# Patient Record
Sex: Female | Born: 1980 | Hispanic: Yes | Marital: Single | Smoking: Never smoker
Health system: Southern US, Community
[De-identification: ages and names within clinical notes are randomized; demographics above are authoritative.]

## PROBLEM LIST (undated history)

## (undated) DIAGNOSIS — Z87440 Personal history of urinary (tract) infections: Secondary | ICD-10-CM

## (undated) DIAGNOSIS — H538 Other visual disturbances: Secondary | ICD-10-CM

## (undated) DIAGNOSIS — D649 Anemia, unspecified: Secondary | ICD-10-CM

## (undated) DIAGNOSIS — F32A Depression, unspecified: Secondary | ICD-10-CM

## (undated) DIAGNOSIS — R635 Abnormal weight gain: Secondary | ICD-10-CM

## (undated) DIAGNOSIS — O24419 Gestational diabetes mellitus in pregnancy, unspecified control: Secondary | ICD-10-CM

## (undated) DIAGNOSIS — Z862 Personal history of diseases of the blood and blood-forming organs and certain disorders involving the immune mechanism: Secondary | ICD-10-CM

## (undated) DIAGNOSIS — Z8632 Personal history of gestational diabetes: Secondary | ICD-10-CM

## (undated) DIAGNOSIS — N898 Other specified noninflammatory disorders of vagina: Secondary | ICD-10-CM

## (undated) DIAGNOSIS — R87629 Unspecified abnormal cytological findings in specimens from vagina: Secondary | ICD-10-CM

## (undated) HISTORY — DX: Other specified noninflammatory disorders of vagina: N89.8

## (undated) HISTORY — DX: Personal history of urinary (tract) infections: Z87.440

## (undated) HISTORY — DX: Abnormal weight gain: R63.5

## (undated) HISTORY — DX: Other visual disturbances: H53.8

## (undated) HISTORY — DX: Unspecified abnormal cytological findings in specimens from vagina: R87.629

## (undated) HISTORY — DX: Depression, unspecified: F32.A

## (undated) HISTORY — DX: Gestational diabetes mellitus in pregnancy, unspecified control: O24.419

## (undated) HISTORY — DX: Personal history of diseases of the blood and blood-forming organs and certain disorders involving the immune mechanism: Z86.2

## (undated) HISTORY — PX: LEEP: SHX91

## (undated) HISTORY — DX: Personal history of gestational diabetes: Z86.32

---

## 2003-03-10 DIAGNOSIS — O99891 Other specified diseases and conditions complicating pregnancy: Secondary | ICD-10-CM

## 2003-03-10 DIAGNOSIS — Z5189 Encounter for other specified aftercare: Secondary | ICD-10-CM

## 2003-03-10 HISTORY — DX: Encounter for other specified aftercare: Z51.89

## 2005-06-02 ENCOUNTER — Emergency Department: Payer: Self-pay | Admitting: Emergency Medicine

## 2008-08-16 ENCOUNTER — Emergency Department: Payer: Self-pay | Admitting: Unknown Physician Specialty

## 2011-10-05 ENCOUNTER — Emergency Department: Payer: Self-pay | Admitting: Emergency Medicine

## 2012-03-12 ENCOUNTER — Inpatient Hospital Stay: Payer: Self-pay | Admitting: Internal Medicine

## 2012-03-12 DIAGNOSIS — O24419 Gestational diabetes mellitus in pregnancy, unspecified control: Secondary | ICD-10-CM

## 2012-03-12 LAB — URINALYSIS, COMPLETE
Bilirubin,UR: NEGATIVE
Blood: NEGATIVE
Glucose,UR: NEGATIVE mg/dL (ref 0–75)
Leukocyte Esterase: NEGATIVE
Nitrite: NEGATIVE
Ph: 6 (ref 4.5–8.0)
Protein: NEGATIVE
RBC,UR: NONE SEEN /HPF (ref 0–5)
Specific Gravity: 1.006 (ref 1.003–1.030)
Squamous Epithelial: <1

## 2012-03-12 LAB — CBC WITH DIFFERENTIAL/PLATELET
Basophil #: 0 10*3/uL (ref 0.0–0.1)
Basophil %: 0.3 %
HCT: 37.1 % (ref 35.0–47.0)
HGB: 12.3 g/dL (ref 12.0–16.0)
MCH: 30.7 pg (ref 26.0–34.0)
MCHC: 33 g/dL (ref 32.0–36.0)
MCV: 93 fL (ref 80–100)
Monocyte #: 1 x10 3/mm — ABNORMAL HIGH (ref 0.2–0.9)
Monocyte %: 6.8 %
Platelet: 268 10*3/uL (ref 150–440)
RBC: 3.99 10*6/uL (ref 3.80–5.20)
WBC: 14.3 10*3/uL — ABNORMAL HIGH (ref 3.6–11.0)

## 2012-03-13 LAB — HEMATOCRIT: HCT: 30.9 % — ABNORMAL LOW (ref 35.0–47.0)

## 2012-03-28 ENCOUNTER — Emergency Department: Payer: Self-pay | Admitting: Unknown Physician Specialty

## 2012-03-28 LAB — COMPREHENSIVE METABOLIC PANEL
Alkaline Phosphatase: 140 U/L — ABNORMAL HIGH (ref 50–136)
Anion Gap: 12 (ref 7–16)
BUN: 11 mg/dL (ref 7–18)
Calcium, Total: 9.3 mg/dL (ref 8.5–10.1)
Chloride: 104 mmol/L (ref 98–107)
Co2: 22 mmol/L (ref 21–32)
Creatinine: 1.02 mg/dL (ref 0.60–1.30)
EGFR (African American): >60
EGFR (Non-African Amer.): >60
Osmolality: 275 (ref 275–301)
Potassium: 3.7 mmol/L (ref 3.5–5.1)
SGOT(AST): 35 U/L (ref 15–37)
Sodium: 138 mmol/L (ref 136–145)
Total Protein: 8.5 g/dL — ABNORMAL HIGH (ref 6.4–8.2)

## 2012-03-28 LAB — CBC
MCH: 30.4 pg (ref 26.0–34.0)
MCHC: 33.1 g/dL (ref 32.0–36.0)
Platelet: 382 10*3/uL (ref 150–440)
RBC: 4.31 10*6/uL (ref 3.80–5.20)
RDW: 13.8 % (ref 11.5–14.5)

## 2014-06-21 ENCOUNTER — Encounter: Payer: Self-pay | Admitting: Obstetrics and Gynecology

## 2014-07-26 ENCOUNTER — Encounter: Payer: Self-pay | Admitting: Obstetrics and Gynecology

## 2014-09-08 ENCOUNTER — Ambulatory Visit: Payer: Self-pay | Admitting: Physician Assistant

## 2014-11-25 ENCOUNTER — Observation Stay: Payer: Self-pay

## 2014-11-25 LAB — WET PREP, GENITAL

## 2014-12-13 ENCOUNTER — Ambulatory Visit: Payer: Self-pay | Admitting: Family Medicine

## 2015-01-03 ENCOUNTER — Inpatient Hospital Stay: Payer: Self-pay

## 2015-03-08 NOTE — H&P (Signed)
L&D Evaluation:  History:   HPI 34 year old G6P5005 presents to L&D at 40 weeks 6 days with c/o ctx's.  EDD 03/06/12, PNC at [facility] notable for early entry to care, elevated 1 hour glucola, WNL 3 hour GTT.  Ob hx of 5 previous vaginal deliveries (all female in 5198, 2201, 302, 04, 06) all of which were LGA (9-8, 10-8, 10-9, 9-8) except for 1st child (6-7). With 4th delivery pt had PP hemorrhage and 4th degree tear- she recieved blood transfusion (states she passed out and ex-husband made decision for her but she is Jehov[employer]'s Witness and WILL NOT accept transfusion). 5th child she had no complications other than fast labor (states baby [facility] 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  Labs: O Positive, RI, VZ Non-immune, Hep B Neg, HIV Neg, RPR NR early glucola 114, repeat 139, 3 hour all normal values GC/Chlam and GBS NEgative Tdap recieved 12/14/11    Presents with contractions    Patient's Medical History other  abnormal pap smear    Patient's Surgical History other  CONE bx    Medications Pre Natal Vitamins    Allergies NKDA    Social History none    Family History Non-Contributory   ROS:   ROS All systems were reviewed.  HEENT, CNS, GI, GU, Respiratory, CV, Renal and Musculoskeletal systems were found to be normal.   Exam:   Vital Signs stable    Urine Protein not completed    General no apparent distress    Mental Status clear    Abdomen gravid, tender with contractions    Estimated Fetal Weight Large for gestational age    Edema no edema    Pelvic 1-2 cm per RN exam, 90% effaced    Mebranes Intact    FHT normal rate with no decels    Ucx irregular   Impression:   Impression early labor, postdates   Plan:   Plan EFM/NST, monitor contractions and for cervical change, admit for labor, may start Pitocin for induction if no cervical  change, AROM when appropriate.   Electronic Signatures: Shella Maximutnam, Eliyana Pagliaro (CNM)  (Signed 15-May-13 12:35)  Authored: L&D Evaluation   Last Updated: 15-May-13 12:35 by Shella MaximPutnam, Jatniel Verastegui (CNM)

## 2015-03-08 NOTE — H&P (Signed)
L&D Evaluation:  History:  HPI Pt is a 34 yo G7P6006 at 40.[redacted] weeks GA with an EDC of 12/30/14 pt of the health department who presents to L&D with reports of contractions. Her pregnancy history is significant for history of LGA babies with the largest being 10#9oz, PPH with transfusion in 2004, and shoulder dystocia with one of her 10# babies. This pregnancy has been uncomplicated. She is O+, RI, VI, GBS negative. She recieved her flu and Tdap vaccine this pregnancy.   Presents with contractions   Patient's Medical History GDM in past pregnancy, CIN3   Patient's Surgical History other  cold knife cone   Medications Pre Natal Vitamins   Allergies NKDA   Social History none   Family History Non-Contributory   ROS:  ROS All systems were reviewed.  HEENT, CNS, GI, GU, Respiratory, CV, Renal and Musculoskeletal systems were found to be normal.   Exam:  Vital Signs stable   General no apparent distress   Mental Status clear   Chest clear   Heart normal sinus rhythm   Abdomen gravid, tender with contractions   Pelvic no external lesions, 2-3/80/-2 upon admission around 7am   Mebranes Intact   FHT normal rate with no decels, 130s, moderate variability, +accels, no decels   Ucx irregular, every 3-6 minutes   Skin dry, no lesions, no rashes   Lymph no lymphadenopathy   Impression:  Impression reactive NST, IUP at 40.4, labor   Plan:  Plan EFM/NST, monitor contractions and for cervical change, admit for labor per Dr. Tiburcio PeaHarris, epidural when appropriate, anticipate SVD.   Follow Up Appointment need to schedule. in 6 weeks   Electronic Signatures: Jannet MantisSubudhi, Sonna Lipsky (CNM)  (Signed 07-Mar-16 09:11)  Authored: L&D Evaluation   Last Updated: 07-Mar-16 09:11 by Jannet MantisSubudhi, Melva Faux (CNM)

## 2020-01-15 ENCOUNTER — Other Ambulatory Visit: Payer: Self-pay

## 2020-01-15 ENCOUNTER — Ambulatory Visit: Payer: Self-pay | Attending: Internal Medicine

## 2020-01-15 DIAGNOSIS — Z23 Encounter for immunization: Secondary | ICD-10-CM

## 2020-01-15 NOTE — Progress Notes (Signed)
   Covid-19 Vaccination Clinic  Name:  Julie Austin    MRN: [id number] DOB: Sep 23, 1981  01/15/2020  Ms. Julie Austin was observed post Covid-19 immunization for 15 minutes without incident. She was provided with Vaccine Information Sheet and instruction to access the V-Safe system.   Ms. Julie Austin was instructed to call 911 with any severe reactions post vaccine: Marland Kitchen Difficulty breathing  . Swelling of face and throat  . A fast heartbeat  . A bad rash all over body  . Dizziness and weakness   Immunizations Administered    Name Date Dose VIS Date Route   Pfizer COVID-19 Vaccine 01/15/2020  5:04 PM 0.3 mL 10/09/2019 Intramuscular   Manufacturer: [employer], [employer]   Lot: [id number]   NDC: [address]-6

## 2020-02-05 ENCOUNTER — Ambulatory Visit: Payer: Self-pay | Attending: Internal Medicine

## 2020-02-05 DIAGNOSIS — Z23 Encounter for immunization: Secondary | ICD-10-CM

## 2020-02-05 NOTE — Progress Notes (Signed)
   Covid-19 Vaccination Clinic  Name:  Julie Austin    MRN: [id number] DOB: 30-Oct-1980  02/05/2020  Ms. Julie Austin was observed post Covid-19 immunization for 15 minutes without incident. She was provided with Vaccine Information Sheet and instruction to access the V-Safe system.   Ms. Julie Austin was instructed to call 911 with any severe reactions post vaccine: Marland Kitchen Difficulty breathing  . Swelling of face and throat  . A fast heartbeat  . A bad rash all over body  . Dizziness and weakness   Immunizations Administered    Name Date Dose VIS Date Route   Pfizer COVID-19 Vaccine 02/05/2020  4:24 PM 0.3 mL 10/09/2019 Intramuscular   Manufacturer: [employer], [employer]   Lot: [id number]   NDC: [address]-5

## 2020-12-27 ENCOUNTER — Encounter: Payer: Self-pay | Admitting: Advanced Practice Midwife

## 2020-12-27 ENCOUNTER — Other Ambulatory Visit: Payer: Self-pay

## 2020-12-27 ENCOUNTER — Ambulatory Visit: Payer: Self-pay | Admitting: Advanced Practice Midwife

## 2020-12-27 ENCOUNTER — Ambulatory Visit (LOCAL_COMMUNITY_HEALTH_CENTER): Payer: Self-pay | Admitting: Advanced Practice Midwife

## 2020-12-27 VITALS — BP 100/64 | Ht 61.0 in | Wt 148.0 lb

## 2020-12-27 DIAGNOSIS — Z30432 Encounter for removal of intrauterine contraceptive device: Secondary | ICD-10-CM

## 2020-12-27 DIAGNOSIS — Z3009 Encounter for other general counseling and advice on contraception: Secondary | ICD-10-CM

## 2020-12-27 DIAGNOSIS — Z01419 Encounter for gynecological examination (general) (routine) without abnormal findings: Secondary | ICD-10-CM

## 2020-12-27 DIAGNOSIS — O24419 Gestational diabetes mellitus in pregnancy, unspecified control: Secondary | ICD-10-CM

## 2020-12-27 DIAGNOSIS — E663 Overweight: Secondary | ICD-10-CM | POA: Insufficient documentation

## 2020-12-27 DIAGNOSIS — N200 Calculus of kidney: Secondary | ICD-10-CM

## 2020-12-27 DIAGNOSIS — F329 Major depressive disorder, single episode, unspecified: Secondary | ICD-10-CM

## 2020-12-27 DIAGNOSIS — F32A Depression, unspecified: Secondary | ICD-10-CM | POA: Insufficient documentation

## 2020-12-27 HISTORY — DX: Calculus of kidney: N20.0

## 2020-12-27 LAB — WET PREP FOR TRICH, YEAST, CLUE
Trichomonas Exam: NEGATIVE
Yeast Exam: NEGATIVE

## 2020-12-27 NOTE — Progress Notes (Signed)
Please refer to other encounter with physical documentation. Jossie Ng, RN

## 2020-12-27 NOTE — Progress Notes (Signed)
[facility] [address] Main Number: [phone]    Family Planning Visit- Initial Visit  Subjective:  Julie Austin is a 40 y.o. SHF G7P7000 (23,21,19, 17, 15, 8, 5)  being seen today for an initial well woman visit and to discuss family planning options.  She is currently using IUD Paragard for pregnancy prevention. Patient reports she does want a pregnancy in the next year.  Patient has the following medical conditions has Overweight BMI=27.9 on their problem list.  Chief Complaint  Patient presents with  . Annual Exam    Patient reports wants Paraguard removed so she can conceive.  Paraguard inserted 02/14/15. LMP 11/12/20.  Last sex 12/23/20 without condom; with current partner x 6 mo; 2 partners in last 3 mo.  Employed 35 hours/wk and living with her 5 kids.  Last pap unsure  Patient denies MJ, cigs, vaping, cigars  Body mass index is 27.96 kg/m. - Patient is eligible for diabetes screening based on BMI and age >27?  not applicable HA1C ordered? not applicable  Patient reports 2  partner/s in last year. Desires STI screening?  Yes  Has patient been screened once for HCV in the past?  No  No results found for: HCVAB  Does the patient have current drug use (including MJ), have a partner with drug use, and/or has been incarcerated since last result? No  If yes-- Screen for HCV through [facility]   Does the patient meet criteria for HBV testing? No  Criteria:  -Household, sexual or needle sharing contact with HBV -History of drug use -HIV positive -Those with known Hep C   Health Maintenance Due  Topic Date Due  . Hepatitis C Screening  Never done  . COVID-19 Vaccine (1) Never done  . HIV Screening  Never done  . TETANUS/TDAP  Never done  . PAP SMEAR-Modifier  Never done  . INFLUENZA VACCINE  Never done    Review of Systems  Constitutional: Positive for weight loss (10 lb wt gain in past 2 mo;  zumba +wts 6-7x/wk).  Eyes: Positive for blurred vision (onset 4 years ago relieved when wears glasses).  All other systems reviewed and are negative.   The following portions of the patient's history were reviewed and updated as appropriate: allergies, current medications, past family history, past medical history, past social history, past surgical history and problem list. Problem list updated.   See flowsheet for other program required questions.  Objective:   Vitals:   12/27/20 1513  BP: 100/64  Weight: 148 lb (67.1 kg)  Height: 5\' 1"  (1.549 m)    Physical Exam Constitutional:      Appearance: Normal appearance. She is normal weight.  HENT:     Head: Normocephalic and atraumatic.     Mouth/Throat:     Mouth: Mucous membranes are moist.     Comments: Last dental exam 6 mo ago Eyes:     Conjunctiva/sclera: Conjunctivae normal.  Neck:     Comments: Thyroid without masses or enlargement Negative cervical lymphadenopathy Cardiovascular:     Rate and Rhythm: Normal rate and regular rhythm.  Pulmonary:     Effort: Pulmonary effort is normal.     Breath sounds: Normal breath sounds.  Chest:  Breasts:     Right: Normal.     Left: Normal.    Abdominal:     Palpations: Abdomen is soft.     Comments: Soft, poor tone, without masses or  tenderness  Genitourinary:    General: Normal vulva.     Exam position: Lithotomy position.     Vagina: Vaginal discharge (white creamy leukorrhea, ph<4.5) present.     Cervix: Normal.     Uterus: Normal.      Adnexa: Left adnexa normal.     Rectum: Normal.     Comments: Pap done Paraguard removed easily and shown to pt Musculoskeletal:        General: Normal range of motion.     Cervical back: Normal range of motion and neck supple.  Skin:    General: Skin is warm and dry.  Neurological:     Mental Status: She is alert.  Psychiatric:        Mood and Affect: Mood normal.       Assessment and Plan:  Julie Austin is  a 40 y.o. female presenting to the [facility] Department for an initial well woman exam/family planning visit  Contraception counseling: Reviewed all forms of birth control options in the tiered based approach. available including abstinence; over the counter/barrier methods; hormonal contraceptive medication including pill, patch, ring, injection,contraceptive implant, ECP; hormonal and nonhormonal IUDs; permanent sterilization options including vasectomy and the various tubal sterilization modalities. Risks, benefits, and typical effectiveness rates were reviewed.  Questions were answered.  Written information was also given to the patient to review.  Patient desires to conceive this was prescribed for patient. She will follow up in prn for surveillance.  She was told to call with any further questions, or with any concerns about this method of contraception.  Emphasized use of condoms 100% of the time for STI prevention. Patient was not offered ECP . ECP was not accepted by the patient. ECP counseling was not given - see RN documentation  1. Overweight BMI=27.9  2. Well woman exam with routine gynecological exam Treat wet mount per standing orders Immunization nurse consult - IGP, Aptima HPV - Chlamydia/Gonorrhea [facility] - WET PREP FOR TRICH, YEAST, CLUE  3. Encounter for removal of intrauterine contraceptive device (IUD)   IUD Removal  Patient identified, informed consent performed, consent signed.  Patient was in the dorsal lithotomy position, normal external genitalia was noted.  A speculum was placed in the patient's vagina, normal discharge was noted, no lesions. The cervix was visualized, no lesions, no abnormal discharge.  The strings of the IUD were grasped and pulled using ring forceps. The IUD was removed in its entirety.   Patient tolerated the procedure well.    Patient will use nothing for contraception/plans for pregnancy soon and she was told to avoid  teratogens, take PNV and folic acid.  Routine preventative health maintenance measures emphasized.      Return for PRN.  No future appointments.  Alberteen Spindle, CNM

## 2020-12-27 NOTE — Progress Notes (Addendum)
Wet mount reviewed - negative. ROI faxed to [facility] for all prior PAP reports. Fax confirmation received. Jossie Ng, RN Requested PAP smear report received, labeled and sent for scanning 12/28/2020. Jossie Ng, RN

## 2021-01-02 LAB — IGP, APTIMA HPV
HPV Aptima: NEGATIVE
PAP Smear Comment: 0

## 2021-01-03 ENCOUNTER — Telehealth: Payer: Self-pay | Admitting: Student

## 2021-01-03 NOTE — Telephone Encounter (Signed)
Attempted TC, LM. Pt needs tx for Chlamydia. Sharlyne Pacas, RN

## 2021-01-04 NOTE — Telephone Encounter (Signed)
Phone call to pt. Left message that RN with [facility] is calling. Please call [phone] for details.

## 2021-01-05 ENCOUNTER — Telehealth: Payer: Self-pay | Admitting: Student

## 2021-01-05 ENCOUNTER — Encounter: Payer: Self-pay | Admitting: Student

## 2021-01-05 ENCOUNTER — Encounter: Payer: Self-pay | Admitting: Advanced Practice Midwife

## 2021-01-05 NOTE — Telephone Encounter (Signed)
Letter mailed to home address on file. Pt needs STD tx.Jasmine Daine Gip, RN

## 2021-01-05 NOTE — Telephone Encounter (Signed)
Call to patient. No answer. LMOM to return call [phone]. Will send letter to home at this time for patient to schedule tx appt.   Harvie Heck, RN

## 2021-01-05 NOTE — Telephone Encounter (Signed)
Letter mailed to home address on file  today.   Resa Miner, RN

## 2021-01-09 ENCOUNTER — Telehealth: Payer: Self-pay

## 2021-01-09 NOTE — Telephone Encounter (Signed)
Informed of positive chlamydia results and need for treatment.  Appt scheduled 3/16 @ 3:20 at patient request.

## 2021-01-11 ENCOUNTER — Ambulatory Visit: Payer: Self-pay

## 2021-01-11 ENCOUNTER — Other Ambulatory Visit: Payer: Self-pay

## 2021-01-11 DIAGNOSIS — A749 Chlamydial infection, unspecified: Secondary | ICD-10-CM

## 2021-01-11 MED ORDER — AZITHROMYCIN 500 MG PO TABS: 500.0000 mg | ORAL_TABLET | Freq: Every day | ORAL | 0 refills | Status: DC

## 2021-01-11 MED ORDER — AZITHROMYCIN 500 MG PO TABS: 1000.0000 mg | ORAL_TABLET | Freq: Once | ORAL | Status: DC

## 2021-02-06 ENCOUNTER — Telehealth: Payer: Self-pay | Admitting: Family Medicine

## 2021-02-06 NOTE — Telephone Encounter (Signed)
Patient called to ask nurse about her IUD. Patient said she had sexual intercourse this past Sunday and took allergy medication and was concerned if she may be pregnant and if it would hurt the baby?

## 2021-02-25 ENCOUNTER — Inpatient Hospital Stay
Admission: EM | Admit: 2021-02-25 | Discharge: 2021-02-27 | DRG: 872 | Disposition: A | Discharge status: Home / Self Care | Payer: Medicaid Other | Attending: Internal Medicine | Admitting: Internal Medicine

## 2021-02-25 ENCOUNTER — Emergency Department: Payer: Medicaid Other

## 2021-02-25 ENCOUNTER — Other Ambulatory Visit: Payer: Self-pay

## 2021-02-25 DIAGNOSIS — A419 Sepsis, unspecified organism: Principal | ICD-10-CM | POA: Diagnosis present

## 2021-02-25 DIAGNOSIS — Z20822 Contact with and (suspected) exposure to covid-19: Secondary | ICD-10-CM | POA: Diagnosis present

## 2021-02-25 DIAGNOSIS — Z79899 Other long term (current) drug therapy: Secondary | ICD-10-CM | POA: Diagnosis not present

## 2021-02-25 DIAGNOSIS — Z87440 Personal history of urinary (tract) infections: Secondary | ICD-10-CM | POA: Diagnosis not present

## 2021-02-25 DIAGNOSIS — D509 Iron deficiency anemia, unspecified: Secondary | ICD-10-CM | POA: Diagnosis present

## 2021-02-25 DIAGNOSIS — E871 Hypo-osmolality and hyponatremia: Secondary | ICD-10-CM | POA: Diagnosis present

## 2021-02-25 DIAGNOSIS — E861 Hypovolemia: Secondary | ICD-10-CM | POA: Diagnosis present

## 2021-02-25 DIAGNOSIS — Z8632 Personal history of gestational diabetes: Secondary | ICD-10-CM | POA: Diagnosis not present

## 2021-02-25 DIAGNOSIS — Z833 Family history of diabetes mellitus: Secondary | ICD-10-CM | POA: Diagnosis not present

## 2021-02-25 DIAGNOSIS — Z8249 Family history of ischemic heart disease and other diseases of the circulatory system: Secondary | ICD-10-CM

## 2021-02-25 DIAGNOSIS — N200 Calculus of kidney: Secondary | ICD-10-CM | POA: Diagnosis present

## 2021-02-25 DIAGNOSIS — R112 Nausea with vomiting, unspecified: Secondary | ICD-10-CM | POA: Diagnosis present

## 2021-02-25 DIAGNOSIS — N136 Pyonephrosis: Secondary | ICD-10-CM | POA: Diagnosis present

## 2021-02-25 DIAGNOSIS — N390 Urinary tract infection, site not specified: Secondary | ICD-10-CM | POA: Diagnosis present

## 2021-02-25 DIAGNOSIS — N132 Hydronephrosis with renal and ureteral calculous obstruction: Secondary | ICD-10-CM | POA: Diagnosis present

## 2021-02-25 DIAGNOSIS — N12 Tubulo-interstitial nephritis, not specified as acute or chronic: Secondary | ICD-10-CM

## 2021-02-25 DIAGNOSIS — N10 Acute tubulo-interstitial nephritis: Secondary | ICD-10-CM | POA: Diagnosis present

## 2021-02-25 DIAGNOSIS — R109 Unspecified abdominal pain: Secondary | ICD-10-CM | POA: Diagnosis present

## 2021-02-25 DIAGNOSIS — E860 Dehydration: Secondary | ICD-10-CM

## 2021-02-25 LAB — CBC
HCT: 32.4 % — ABNORMAL LOW (ref 36.0–46.0)
Hemoglobin: 9.9 g/dL — ABNORMAL LOW (ref 12.0–15.0)
MCH: 24.3 pg — ABNORMAL LOW (ref 26.0–34.0)
MCHC: 30.6 g/dL (ref 30.0–36.0)
MCV: 79.4 fL — ABNORMAL LOW (ref 80.0–100.0)
Platelets: 362 10*3/uL (ref 150–400)
RBC: 4.08 MIL/uL (ref 3.87–5.11)
RDW: 16.3 % — ABNORMAL HIGH (ref 11.5–15.5)
WBC: 7.4 10*3/uL (ref 4.0–10.5)
nRBC: 0 % (ref 0.0–0.2)

## 2021-02-25 LAB — OSMOLALITY, URINE: Osmolality, Ur: 202 mOsm/kg — ABNORMAL LOW (ref 300–900)

## 2021-02-25 LAB — URINALYSIS, COMPLETE (UACMP) WITH MICROSCOPIC
Bilirubin Urine: NEGATIVE
Glucose, UA: NEGATIVE mg/dL
Ketones, ur: NEGATIVE mg/dL
Nitrite: NEGATIVE
Protein, ur: NEGATIVE mg/dL
Specific Gravity, Urine: 1.005 (ref 1.005–1.030)
WBC, UA: >50 WBC/hpf — ABNORMAL HIGH (ref 0–5)
pH: 8 (ref 5.0–8.0)

## 2021-02-25 LAB — RETICULOCYTES
Immature Retic Fract: 13.4 % (ref 2.3–15.9)
RBC.: 3.98 MIL/uL (ref 3.87–5.11)
Retic Count, Absolute: 56.1 10*3/uL (ref 19.0–186.0)
Retic Ct Pct: 1.4 % (ref 0.4–3.1)

## 2021-02-25 LAB — IRON AND TIBC
Iron: 12 ug/dL — ABNORMAL LOW (ref 28–170)
Saturation Ratios: 4 % — ABNORMAL LOW (ref 10.4–31.8)
TIBC: 344 ug/dL (ref 250–450)
UIBC: 332 ug/dL

## 2021-02-25 LAB — BASIC METABOLIC PANEL
Anion gap: 9 (ref 5–15)
BUN: 10 mg/dL (ref 6–20)
CO2: 22 mmol/L (ref 22–32)
Calcium: 8.8 mg/dL — ABNORMAL LOW (ref 8.9–10.3)
Chloride: 101 mmol/L (ref 98–111)
Creatinine, Ser: 0.9 mg/dL (ref 0.44–1.00)
GFR, Estimated: >60 mL/min (ref >60)
Glucose, Bld: 91 mg/dL (ref 70–99)
Potassium: 4 mmol/L (ref 3.5–5.1)
Sodium: 132 mmol/L — ABNORMAL LOW (ref 135–145)

## 2021-02-25 LAB — PROTIME-INR
INR: 1.1 (ref 0.8–1.2)
Prothrombin Time: 14.6 seconds (ref 11.4–15.2)

## 2021-02-25 LAB — POC URINE PREG, ED: Preg Test, Ur: NEGATIVE

## 2021-02-25 LAB — RESP PANEL BY RT-PCR (FLU A&B, COVID) ARPGX2
Influenza A by PCR: NEGATIVE
Influenza B by PCR: NEGATIVE
SARS Coronavirus 2 by RT PCR: NEGATIVE

## 2021-02-25 LAB — OSMOLALITY: Osmolality: 285 mOsm/kg (ref 275–295)

## 2021-02-25 LAB — SODIUM, URINE, RANDOM: Sodium, Ur: 36 mmol/L

## 2021-02-25 LAB — FERRITIN: Ferritin: 84 ng/mL (ref 11–307)

## 2021-02-25 LAB — TSH: TSH: 3.241 u[IU]/mL (ref 0.350–4.500)

## 2021-02-25 LAB — MAGNESIUM: Magnesium: 2.3 mg/dL (ref 1.7–2.4)

## 2021-02-25 LAB — FOLATE: Folate: 24 ng/mL (ref >5.9)

## 2021-02-25 LAB — LACTIC ACID, PLASMA: Lactic Acid, Venous: 0.7 mmol/L (ref 0.5–1.9)

## 2021-02-25 MED ORDER — SODIUM CHLORIDE 0.9 % IV SOLN
1.0000 g | INTRAVENOUS | Status: DC
  Filled 2021-02-25: qty 10

## 2021-02-25 MED ORDER — PHENAZOPYRIDINE HCL 200 MG PO TABS
200.0000 mg | ORAL_TABLET | Freq: Once | ORAL | Status: AC
  Administered 2021-02-25: 200 mg via ORAL
  Filled 2021-02-25: qty 1

## 2021-02-25 MED ORDER — KETOROLAC TROMETHAMINE 15 MG/ML IJ SOLN
15.0000 mg | Freq: Four times a day (QID) | INTRAMUSCULAR | Status: DC | PRN
  Administered 2021-02-26 – 2021-02-27 (×2): 15 mg via INTRAVENOUS
  Filled 2021-02-25 (×3): qty 1

## 2021-02-25 MED ORDER — SODIUM CHLORIDE 0.9 % IV SOLN
1.0000 g | Freq: Once | INTRAVENOUS | Status: AC
  Administered 2021-02-25: 1 g via INTRAVENOUS
  Filled 2021-02-25: qty 10

## 2021-02-25 MED ORDER — HYDROMORPHONE HCL 1 MG/ML IJ SOLN: 0.5000 mg | INTRAMUSCULAR | Status: DC | PRN

## 2021-02-25 MED ORDER — ACETAMINOPHEN 650 MG RE SUPP: 650.0000 mg | Freq: Four times a day (QID) | RECTAL | Status: DC | PRN

## 2021-02-25 MED ORDER — ONDANSETRON HCL 4 MG/2ML IJ SOLN: 4.0000 mg | Freq: Four times a day (QID) | INTRAMUSCULAR | Status: DC | PRN

## 2021-02-25 MED ORDER — NALOXONE HCL 2 MG/2ML IJ SOSY
0.4000 mg | PREFILLED_SYRINGE | INTRAMUSCULAR | Status: DC | PRN
  Filled 2021-02-25: qty 2

## 2021-02-25 MED ORDER — ACETAMINOPHEN 325 MG PO TABS
650.0000 mg | ORAL_TABLET | Freq: Four times a day (QID) | ORAL | Status: DC | PRN
  Administered 2021-02-25: 650 mg via ORAL
  Filled 2021-02-25: qty 2

## 2021-02-25 MED ORDER — LACTATED RINGERS IV SOLN: INTRAVENOUS | Status: DC

## 2021-02-25 MED ORDER — KETOROLAC TROMETHAMINE 30 MG/ML IJ SOLN: 30.0000 mg | Freq: Once | INTRAMUSCULAR | Status: DC

## 2021-02-25 NOTE — H&P (Signed)
History and Physical    PLEASE NOTE THAT DRAGON DICTATION SOFTWARE WAS USED IN THE CONSTRUCTION OF THIS NOTE.   Julie Austin XKG:[id number] DOB: 01-Mar-1981 DOA: 02/25/2021  PCP: [facility] Patient coming from: home   I have personally briefly reviewed patient's old medical records in [place]  Chief Complaint: Left flank pain  HPI: Julie Austin is a 40 y.o. female with medical history significant for chronic anemia, gestational diabetes, who is admitted to [facility] on 02/25/2021 with obstructing left nephrolithiasis after presenting from home to [facility] ED complaining of left flank pain.   The patient reports that she has been experiencing left flank pain for a little over 2 weeks, with symptoms starting on 02/09/2021, and associated with dysuria in the absence of gross hematuria or change in urinary urgency/frequency.  Shortly after onset of left flank pain, she was diagnosed with a urinary tract infection by her PCP, who subsequently initiated Keflex 500 mg p.o. 3 times daily for 10-day course.  The patient compliantly completed the totality of this 10-day course, and reported a slight improvement in her discomfort and dysuria over that timeframe.  However, within 1 to 2 days of completing the Keflex course, she notes that she developed an objective fever, with a temperature of 103 at home, associated with worsening left flank discomfort , intermittent nausea resulting in 4-5 episodes of nonbloody, nonbilious emesis over the last 2 to 3 days, as well as return of her dysuria.  Given the worsening of her symptoms, the patient's PCP started her on Bactrim a few days ago, however the patient noted no ensuing improvement in the above symptoms while taking this antibiotic.  In order to try to manage her left flank discomfort as an outpatient with the patient reports that she has been taking as needed ibuprofen only, without any opioid  intervention.  She reports suboptimal left flank pain control with as needed ibuprofen at home, and continues to experience intermittent nausea/vomiting as well as dysuria and subjective fever, prompting her to present to [facility] ED today for further evaluation and management.  Aside from her reported objective fever, with a temperature max of 103 over the last few days, she denies any associated chills, full body rigors, or generalized myalgias.  Most recent episode of nausea/vomiting occurred shortly prior to presenting to [facility] ED today.  Denies any associated diarrhea, melena, hematochezia, or rash.  She denies any known prior history of kidney stones.  No recent trauma or travel.  Denies any recent headache, neck stiffness, rhinitis, rhinorrhea, sore throat, shortness of breath, wheezing, cough.  No recent COVID-19 exposures.  She denies any history of underlying coronary artery disease or known congestive heart failure.  She denies any recent chest pain, diaphoresis, palpitations, dizziness, presyncope, or syncope.  She also denies any recent orthopnea, PND, or peripheral edema.  She confirms that she is on no blood thinners at home, including no aspirin.     ED Course:  Vital signs in the ED were notable for the following: Temperature max 100.1; HR 94-108 following interval IV fluids, as further described below; respiratory rate 14-18; blood pressure 105/59 -112/70; oxygen saturation 98 to 100% on room air.  Labs were notable for the following: BMP was notable for the following: Sodium 132 compared to only other prior serum sodium value of 138 in 2013, potassium 4.0, bicarbonate 22, anion gap 9, BUN 10, creatinine 0.90 relative to only the prior serum creatinine data point  of 1.02 in 2013.  CBC notable for white blood cell count of 7400, hemoglobin 9.9 without prior hemoglobin data point in our EMR for point comparison, with presenting hemoglobin noted to be associated with MCV 79, MCHC 30, and  elevated RDW of 16.3.  Platelets 362.  INR has been checked, with result currently pending.  Qualitative urine pregnancy test was found to be negative.  Urinalysis showed greater than 50 white blood cells and large leukocyte Estrace.  Nasopharyngeal COVID-19/influenza PCR was performed in the ED today and found to be positive.  Blood cultures x2 and urine sample was sent for culture prior to initiation of antibiotics.  CT renal stone study showed that the left kidney is enlarged with moderate hydronephrosis secondary to a large obstructing stone in the renal pelvis measuring approximately 2.9 x 1.7 cm, with proximal mid left ureter dilation and demonstrating diffuse circumferential wall thickening.  In the setting of the large nature of the patient's obstructing stone, the case and imaging were discussed with the on-call interventional radiologist, Dr.Mir, who will place a percutaneous nephrostomy tube tomorrow morning (02/26/21).  He requests that the patient be made n.p.o. after midnight in anticipation of this procedure.  While in the ED, the following were administered: Rocephin 1 g IV x1, Tylenol 650 mg p.o. x1.      Review of Systems: As per HPI otherwise 10 point review of systems negative.   Past Medical History:  Diagnosis Date  . Blurry vision    wears glasses  . History of anemia   . History of gestational diabetes   . History of urinary tract infection   . Vaginal itching   . Weight gain     Past Surgical History:  Procedure Laterality Date  . LEEP     [facility] (thinks this was procedure as done under general anesthesia due to an abnormal PAP)    Social History:  reports that she has never smoked. She has never used smokeless tobacco. She reports current alcohol use of about 1.0 standard drink of alcohol per week. She reports that she does not use drugs.   No Known Allergies  Family History  Problem Relation Age of Onset  . Diabetes Paternal Grandfather   . Diabetes  Paternal Grandmother   . Diabetes Maternal Grandmother   . Heart disease Maternal Grandmother   . Diabetes Maternal Grandfather   . Diabetes Father   . Migraines Mother   . Kidney disease Son     Family history reviewed and not pertinent    Prior to Admission medications   Medication Sig Start Date End Date Taking? Authorizing Provider  Ascorbic Acid (VITAMIN C) 100 MG tablet Take 100 mg by mouth daily.   Yes [provider]  BACTRIM DS 800-160 MG tablet Take 1 tablet by mouth 2 (two) times daily. 02/23/21  Yes [provider]  cholecalciferol (VITAMIN D3) 25 MCG (1000 UNIT) tablet Take 1,000 Units by mouth daily.   Yes [provider]  ibuprofen (ADVIL) 200 MG tablet Take 400 mg by mouth every 6 (six) hours as needed for moderate pain or mild pain.   Yes [provider]  Multiple Vitamin (MULTIVITAMIN) tablet Take 1 tablet by mouth daily.   Yes [provider]  azithromycin (ZITHROMAX) 500 MG tablet Take 1 tablet (500 mg total) by mouth daily. Patient not taking: Reported on 02/25/2021 01/11/21   Caren Macadam, MD  cephALEXin (KEFLEX) 500 MG capsule Take 500 mg by mouth 3 (three)  times daily. Patient not taking: Reported on 02/25/2021 02/09/21   [provider]     Objective    Physical Exam: Vitals:   02/25/21 1432 02/25/21 1719 02/25/21 1824 02/25/21 1830  BP:  112/70  (!) 105/59  Pulse:  (!) 101  94  Resp:  16  14  Temp:   100 F (37.8 C)   TempSrc:   Oral   SpO2:  98%  99%  Weight: 63.5 kg     Height: _0  (1.575 m)       General: appears to be stated age; alert, oriented Skin: warm, dry, no rash Head:  AT/[place] Mouth:  Oral mucosa membranes appear dry, normal dentition Neck: supple; trachea midline Heart: Mildly tachycardic but regular; did not appreciate any M/R/G Lungs: CTAB, did not appreciate any wheezes, rales, or rhonchi Abdomen: + BS; soft, ND; + left flank back in the absence of any associated  guarding, rigidity, or rebound tenderness Vascular: 2+ pedal pulses b/l; 2+ radial pulses b/l Extremities: no peripheral edema, no muscle wasting Neuro: strength and sensation intact in upper and lower extremities b/l     Labs on Admission: I have personally reviewed following labs and imaging studies  CBC: Recent Labs  Lab 02/25/21 1434  WBC 7.4  HGB 9.9*  HCT 32.4*  MCV 79.4*  PLT 428   Basic Metabolic Panel: Recent Labs  Lab 02/25/21 1434  NA 132*  K 4.0  CL 101  CO2 22  GLUCOSE 91  BUN 10  CREATININE 0.90  CALCIUM 8.8*   GFR: Estimated Creatinine Clearance: 74.3 mL/min (by C-G formula based on SCr of 0.9 mg/dL). Liver Function Tests: No results for input(s): AST, ALT, ALKPHOS, BILITOT, PROT, ALBUMIN in the last 168 hours. No results for input(s): LIPASE, AMYLASE in the last 168 hours. No results for input(s): AMMONIA in the last 168 hours. Coagulation Profile: No results for input(s): INR, PROTIME in the last 168 hours. Cardiac Enzymes: No results for input(s): CKTOTAL, CKMB, CKMBINDEX, TROPONINI in the last 168 hours. BNP (last 3 results) No results for input(s): PROBNP in the last 8760 hours. HbA1C: No results for input(s): HGBA1C in the last 72 hours. CBG: No results for input(s): GLUCAP in the last 168 hours. Lipid Profile: No results for input(s): CHOL, HDL, LDLCALC, TRIG, CHOLHDL, LDLDIRECT in the last 72 hours. Thyroid Function Tests: No results for input(s): TSH, T4TOTAL, FREET4, T3FREE, THYROIDAB in the last 72 hours. Anemia Panel: No results for input(s): VITAMINB12, FOLATE, FERRITIN, TIBC, IRON, RETICCTPCT in the last 72 hours. Urine analysis:    Component Value Date/Time   COLORURINE STRAW (A) 02/25/2021 1434   APPEARANCEUR HAZY (A) 02/25/2021 1434   APPEARANCEUR Clear 03/12/2012 1640   LABSPEC 1.005 02/25/2021 1434   LABSPEC 1.006 03/12/2012 1640   PHURINE 8.0 02/25/2021 1434   GLUCOSEU NEGATIVE 02/25/2021 1434   GLUCOSEU Negative  03/12/2012 1640   HGBUR SMALL (A) 02/25/2021 1434   BILIRUBINUR NEGATIVE 02/25/2021 1434   BILIRUBINUR Negative 03/12/2012 1640   KETONESUR NEGATIVE 02/25/2021 1434   PROTEINUR NEGATIVE 02/25/2021 1434   NITRITE NEGATIVE 02/25/2021 1434   LEUKOCYTESUR LARGE (A) 02/25/2021 1434   LEUKOCYTESUR Negative 03/12/2012 1640    Radiological Exams on Admission: CT Renal Stone Study  Result Date: 02/25/2021 CLINICAL DATA:  Flank pain. EXAM: CT ABDOMEN AND PELVIS WITHOUT CONTRAST TECHNIQUE: Multidetector CT imaging of the abdomen and pelvis was performed following the standard protocol without IV contrast. COMPARISON:  None. FINDINGS: Lower chest: The lung bases  are clear. The heart size is normal. Hepatobiliary: The liver is normal. Normal gallbladder.There is no biliary ductal dilation. Pancreas: Normal contours without ductal dilatation. No peripancreatic fluid collection. Spleen: Unremarkable. Adrenals/Urinary Tract: --Adrenal glands: Unremarkable. --Right kidney/ureter: The right kidney is small and somewhat atrophic when compared to the left. There are multiple nonobstructing stones throughout the right kidney consistent with nephrocalcinosis. --Left kidney/ureter: The left kidney is enlarged with moderate hydronephrosis secondary to a large obstructing stone in the renal pelvis measuring approximately 2.9 x 1.7 cm. The proximal mid left ureter is dilated and demonstrates diffuse circumferential wall thickening. --Urinary bladder: Unremarkable. Stomach/Bowel: --Stomach/Duodenum: No hiatal hernia or other gastric abnormality. Normal duodenal course and caliber. --Small bowel: Unremarkable. --Colon: Unremarkable. --Appendix: Normal. Vascular/Lymphatic: Normal course and caliber of the major abdominal vessels. --No retroperitoneal lymphadenopathy. --No mesenteric lymphadenopathy. --No pelvic or inguinal lymphadenopathy. Reproductive: Small uterine fibroids are noted. Other: No ascites or free air. There is a  small fat containing umbilical hernia. Musculoskeletal. No acute displaced fractures. IMPRESSION: 1. Moderate left-sided hydronephrosis secondary to a large obstructing stone in the left renal pelvis. 2. Dilated proximal and mid left ureter with associated diffuse circumferential urothelial wall thickening. Findings may be secondary to obstructing non radiopaque material such as blood clots or debris. Alternatively, findings may be secondary to an ascending urinary tract infection with underlying pyonephrosis. Urologic consultation is recommended. 3. Right-sided nephrocalcinosis without evidence for an obstructing process. Electronically Signed   By: Constance Holster M.D.   On: 02/25/2021 16:45     Assessment/Plan   Julie Austin is a 40 y.o. female with medical history significant for chronic anemia, gestational diabetes, who is admitted to [facility] on 02/25/2021 with obstructing left nephrolithiasis after presenting from home to [facility] ED complaining of left flank pain.    Principal Problem:   Kidney stone on left side Active Problems:   Urinary tract infection   Sepsis (HCC)   Left flank pain   Nausea & vomiting   Acute hyponatremia   Microcytic anemia    #) Obstructing left nephrolithiasis: Diagnosis on the basis of 2 weeks of progressive left flank pain associated with intermittent nausea/vomiting, objective fever, and dysuria, with presenting CT renal stone showing moderate left hydronephrosis secondary to a large obstructing stone in the renal pelvis measuring approximately 2.9 x 1.7 cm.  The patient's symptoms been refractory and progressed and spite of a complete 10-day course of Keflex as well as a partial ensuing course of Bactrim.  Presentation is associated with urinary tract infection, as further described below. In the setting of the large nature of the patient's obstructing stone, the case and imaging were discussed with the on-call interventional  radiologist, Dr.Mir, who will place a percutaneous nephrostomy tube tomorrow morning (02/26/21).  He requests that the patient be made n.p.o. after midnight in anticipation of this procedure.  The patient is not on any blood thinners at home, including no aspirin.  No known history of coronary artery disease or congestive heart failure.  No evidence to suggest acute MI or acutely decompensated heart failure.  Gupta score for this patient in the context of anticipated urologic procedure conveys a 0.01% perioperative risk for significant cardiac event.  No absolute medical contraindications to proceeding with proposed placement of percutaneous nephrostomy tube at this time.  Of note, the patient has a history of gestational diabetes, she denies any history of nongestational diabetes.   Plan: Preoperative EKG x1 now.  Follow for result of INR.  N.p.o. at midnight.  Lactated Ringer's at 100 cc/h.  As needed IV Dilaudid.  As needed IV Zofran.  As needed IV Toradol.  Repeat BMP in the morning as well as CBC at that time.  Work-up and management of sepsis due to associated urinary tract infection, as further described below. Dr. Dwaine Gale of IR consulted, with plan for placement of percutaneous nephrostomy tube tomorrow morning (02/26/21).       #) Sepsis due to urinary tract infection: In the setting of presenting obstructing left nephrolithiasis, diagnosis for urinary tract infection on the basis of 2 weeks of dysuria and objective fever, with presenting urinalysis consistent with urinary tract infection in spite of good compliance with a 10-day course of Keflex, which appears to emphasize the need for source control in the setting of presenting obstructive uropathy.  Given the size of patient's obstructing left nephrolithiasis, IR will place percutaneous nephrostomy tube tomorrow morning.  Ultimately, may also need to involve urology if additional source control is subsequently required in the setting of dilation in  the proximal mid left ureter.  SIRS criteria met via presenting objective fever as well as tachycardia.  Will check stat lactic acid at this time, followed by repeat value in 3 hours.  At the present time, patient sepsis does not meet criteria to be considered severe in nature in the absence of any associated evidence of endorgan damage, although we will closely monitor for ensuing lactate level.  Blood cultures x2 and urine sample were sent for culture in the ED today prior to initiation of empiric Rocephin.  No other source of underlying infection identified at this time, including COVID-19/influenza PCR were found to be negative when checked in the ED today.  No respiratory symptoms to suggest underlying pulmonary infection at the present time.  No evidence of hypotension.   Plan: IV fluids, as above.  Stat lactic acid level, followed by repeat value in 3 hours.  Follow for results of blood cultures x2 as well as urine culture collected in the ED today.  Continue Rocephin.  Repeat CBC with differential in the morning.  Work-up and management, including percutaneous nephrostomy tube intervention for presenting obstructing left nephrolithiasis, as above.      #) Left flank pain: Over 2 weeks of progressive left flank pain in the setting of obstructing left nephrolithiasis, as further detailed above, and prompting consultation to interventional radiology for percutaneous nephrostomy tube for progress toward source control.  As this procedure is anticipated to occur in the morning, will strive for optimizing symptomatic control overnight of the patient's pain as well as her nausea, as further described below.  Plan: As needed IV Dilaudid.  As needed IV Toradol.  Percutaneous nephrostomy tube by IR in the morning, as above.      #) Nausea/vomiting: Intermittent nausea resulting in at least 4-5 episodes of nonbloody, nonbilious emesis over the last 2 to 3 days in the setting of obstructive left  nephrolithiasis and associated urinary tract infection.   Plan: IV Zofran.  Percutaneous nephrostomy tube by IR in the morning.  IV fluids, as above.  Add on serum magnesium level.  Repeat BMP in the morning.      #) Acute hypoosmolar hypovolemic hyponatremia: Presenting serum sodium noted to be 132.  This is suspected to be acute in nature, although unable to fully ascertain the chronicity of this laboratory finding given that most recent prior serum sodium value was from almost 10 years ago at which time sodium was found  to the 138 and 2013.  Suspect at least of partial contribution from extrarenal losses in the setting of dehydration from recent nausea/vomiting in the setting of her obstructing left kidney stone.  It is possible there may be an additional contribution from SIADH in the setting of relative urinary retention due to the obstructive nature of patient's left nephrolithiasis.  We will continue IV fluids to attend to the element of dehydration, while further evaluating for any secondary contribution from SIADH via further laboratory evaluation, as detailed below.   Plan: Lactated Ringer's at 100 cc/h.  Add on random urine sodium and urine osmolality.  We will also check serum osmolality to confirm suspected hypoosmolar etiology.  Check TSH.  Monitor strict I's and O's and daily weights.  Repeat BMP in the morning.       #) Chronic anemia: Per chart review, there is documentation of a history of chronic anemia, although I have not come across documentation of the associated baseline hemoglobin, Norvasc, cross-shaped prior hemoglobin data point for point comparison.  Presenting CBC notable for hemoglobin of 9.9, which is associated with mildly microcytic and normochromic findings as well as mildly elevated RDW.  INR has been ordered, with result currently pending.  We will further evaluate the underlying nature of patient's chronic anemia by adding on the following laboratory  studies.  Plan: Add on the following to labs collected earlier today in the ED: Ferritin, total iron, total iron binding capacity, MMA, folic acid level, and reticulocyte count.  Follow-up result of INR.  Repeat CBC in the morning.      DVT prophylaxis: SCDs Code Status: Full code Family Communication: none Disposition Plan: Per Rounding Team Consults called: Case and imaging were discussed with the on-call interventional radiologist, Dr. Dwaine Gale, who has been formally consulted and plans to place left percutaneous nephrostomy tube in the morning (02/26/21).  Admission status: Inpatient; med telemetry.     Of note, this patient was added by me to the following Admit List/Treatment Team: [email or web address].      PLEASE NOTE THAT DRAGON DICTATION SOFTWARE WAS USED IN THE CONSTRUCTION OF THIS NOTE.   [facility] Pager [phone] From 12PM - 12AM  Otherwise, please contact night-coverage  [email or web address] Password [facility]   02/25/2021, 7:15 PM

## 2021-02-25 NOTE — ED Notes (Signed)
Patient transported to CT 

## 2021-02-25 NOTE — ED Notes (Signed)
Patient is resting comfortably. Call light within reach. Fall precautions in place. Patient waiting for transport to IP unit.

## 2021-02-25 NOTE — ED Provider Notes (Signed)
[facility] Emergency Department Provider Note  Time seen: 4:56 PM  I have reviewed the triage vital signs and the nursing notes.   HISTORY  Chief Complaint infection   HPI Julie Austin is a 40 y.o. female with no significant past medical history presents emergency department for kidney or urinary tract infection.  According to the patient on 4/14 she began experiencing dysuria consistent with prior urinary tract infections.  Patient states she finished a course of antibiotics after 10 days, but is not sure which antibiotic it was.  States she felt better for several days but then began worsening once again so she called her doctor who called her in Bactrim.  Patient has been taking Bactrim but states no improvement this time.  For the past 2 to 3 days she has been experiencing painful urination body aches fever and left flank pain.  Patient is concerned that she has a more severe urinary tract infection so she came to the emergency department for evaluation.   Past Medical History:  Diagnosis Date  . Blurry vision    wears glasses  . History of anemia   . History of gestational diabetes   . History of urinary tract infection   . Vaginal itching   . Weight gain     Patient Active Problem List   Diagnosis Date Noted  . Overweight BMI=27.9 12/27/2020  . hx of gestational diabetes 2016 12/27/2020  . hx pp depression 2016 12/27/2020    Prior to Admission medications   Medication Sig Start Date End Date Taking? Authorizing Provider  azithromycin (ZITHROMAX) 500 MG tablet Take 1 tablet (500 mg total) by mouth daily. 01/11/21   Federico Flake, MD  Multiple Vitamin (MULTIVITAMIN) tablet Take 1 tablet by mouth daily.    [provider]    No Known Allergies  Family History  Problem Relation Age of Onset  . Diabetes Paternal Grandfather   . Diabetes Paternal Grandmother   . Diabetes Maternal Grandmother   . Heart disease Maternal  Grandmother   . Diabetes Maternal Grandfather   . Diabetes Father   . Migraines Mother   . Kidney disease Son     Social History Social History   Tobacco Use  . Smoking status: Never Smoker  . Smokeless tobacco: Never Used  Vaping Use  . Vaping Use: Never used  Substance Use Topics  . Alcohol use: Yes    Alcohol/week: 1.0 standard drink    Types: 1 Glasses of wine per week    Comment: Last ETOH use 12/23/2020 (glass of wine)  . Drug use: Never    Review of Systems Constitutional: Positive for fever ENT: Negative for recent illness/congestion Cardiovascular: Negative for chest pain. Respiratory: Negative for shortness of breath. Gastrointestinal: Left flank pain.  Positive for nausea. Genitourinary: Positive for dysuria. Musculoskeletal: Negative for musculoskeletal complaints Neurological: Negative for headache All other ROS negative  ____________________________________________   PHYSICAL EXAM:  VITAL SIGNS: ED Triage Vitals  Enc Vitals Group     BP 02/25/21 1431 112/61     Pulse Rate 02/25/21 1431 (!) 108     Resp 02/25/21 1431 18     Temp 02/25/21 1431 100.1 F (37.8 C)     Temp Source 02/25/21 1431 Oral     SpO2 02/25/21 1431 100 %     Weight 02/25/21 1432 140 lb (63.5 kg)     Height 02/25/21 1432 5\' 2"  (1.575 m)     Head Circumference --  Peak Flow --      Pain Score 02/25/21 1432 7     Pain Loc --      Pain Edu? --      Excl. in GC? --    Constitutional: Alert and oriented. Well appearing and in no distress. Eyes: Normal exam ENT      Head: Normocephalic and atraumatic.      Mouth/Throat: Mucous membranes are moist. Cardiovascular: Normal rate, regular rhythm. No murmur Respiratory: Normal respiratory effort without tachypnea nor retractions. Breath sounds are clear  Gastrointestinal: Left, mild diffuse tenderness mostly in the right mid to lower abdomen.  Some left lower quadrant tenderness as well.  No rebound guarding or distention.  Mild  left CVA tenderness. Musculoskeletal: Nontender with normal range of motion in all extremities. Neurologic:  Normal speech and language. No gross focal neurologic deficits  Skin:  Skin is warm, dry and intact.  Psychiatric: Mood and affect are normal.  ____________________________________________   RADIOLOGY  IMPRESSION:  1. Moderate left-sided hydronephrosis secondary to a large  obstructing stone in the left renal pelvis.  2. Dilated proximal and mid left ureter with associated diffuse  circumferential urothelial wall thickening. Findings may be  secondary to obstructing non radiopaque material such as blood clots  or debris. Alternatively, findings may be secondary to an ascending  urinary tract infection with underlying pyonephrosis. Urologic  consultation is recommended.  3. Right-sided nephrocalcinosis without evidence for an obstructing  process.   ____________________________________________   INITIAL IMPRESSION / ASSESSMENT AND PLAN / ED COURSE  Pertinent labs & imaging results that were available during my care of the patient were reviewed by me and considered in my medical decision making (see chart for details).   Patient presents emergency department for dysuria left flank pain and fever at home.  Patient has taken a course of antibiotics 3 times a day for 10 days and is now on a course of Bactrim but continues to have fever dysuria and flank pain.  Suspect the patient has a resistant urinary tract infection given urinalysis showing greater than 50 white cells continues to have rare bacteria.  Patient denies any vaginal discharge or bleeding.  States fever as high as 103 at home currently 100.1.  Reassuringly patient's white blood cell count is normal.  Kidney function is normal.  Given the patient's recurrent/resistant UTI with left flank pain and left CVA tenderness we will proceed with CT imaging to further evaluate.  CT scan shows moderate left-sided hydro nephrosis  secondary to large obstructing stone in the left renal pelvis.  Also findings suggestive of possible a sending urinary tract infection.  We will discussed with urology for further work-up and intervention.  We will start the patient on IV Rocephin.  Spoke to interventional radiology who will plan on putting a percutaneous nephrostomy tube and tomorrow morning.  Patient will remain n.p.o. after midnight.  I have ordered IV Rocephin for the patient, IV fluids we will continue to closely monitor.  Kimberlly Norgard was evaluated in Emergency Department on 02/25/2021 for the symptoms described in the history of present illness. She was evaluated in the context of the global COVID-19 pandemic, which necessitated consideration that the patient might be at risk for infection with the SARS-CoV-2 virus that causes COVID-19. Institutional protocols and algorithms that pertain to the evaluation of patients at risk for COVID-19 are in a state of rapid change based on information released by regulatory bodies including the CDC and federal and state organizations.  These policies and algorithms were followed during the patient's care in the ED.  ____________________________________________   FINAL CLINICAL IMPRESSION(S) / ED DIAGNOSES  Left flank pain Urinary tract infection Kidney stone   Minna Antis, MD 02/25/21 1759

## 2021-02-25 NOTE — ED Triage Notes (Signed)
Pt to ED POV for kidney infection dx 2 days ago, states was told she might be on wrong antibiotics since no improvement of sx.  Took ibuprofen PTA for fever.  C/o left flank pain, fever, slight burning with urination

## 2021-02-25 NOTE — ED Notes (Signed)
Secretary called for transport to take patient to IP room.

## 2021-02-26 ENCOUNTER — Inpatient Hospital Stay: Payer: Medicaid Other | Admitting: Radiology

## 2021-02-26 ENCOUNTER — Inpatient Hospital Stay: Payer: Medicaid Other

## 2021-02-26 DIAGNOSIS — N200 Calculus of kidney: Secondary | ICD-10-CM

## 2021-02-26 DIAGNOSIS — N201 Calculus of ureter: Secondary | ICD-10-CM

## 2021-02-26 DIAGNOSIS — E871 Hypo-osmolality and hyponatremia: Secondary | ICD-10-CM

## 2021-02-26 DIAGNOSIS — N132 Hydronephrosis with renal and ureteral calculous obstruction: Secondary | ICD-10-CM

## 2021-02-26 DIAGNOSIS — R109 Unspecified abdominal pain: Secondary | ICD-10-CM

## 2021-02-26 DIAGNOSIS — N1330 Unspecified hydronephrosis: Secondary | ICD-10-CM

## 2021-02-26 DIAGNOSIS — N390 Urinary tract infection, site not specified: Secondary | ICD-10-CM

## 2021-02-26 DIAGNOSIS — N10 Acute tubulo-interstitial nephritis: Secondary | ICD-10-CM

## 2021-02-26 DIAGNOSIS — E860 Dehydration: Secondary | ICD-10-CM

## 2021-02-26 DIAGNOSIS — A419 Sepsis, unspecified organism: Principal | ICD-10-CM

## 2021-02-26 HISTORY — PX: IR NEPHROSTOMY PLACEMENT LEFT: IMG6063

## 2021-02-26 LAB — COMPREHENSIVE METABOLIC PANEL
ALT: 14 U/L (ref 0–44)
AST: 20 U/L (ref 15–41)
Albumin: 3.2 g/dL — ABNORMAL LOW (ref 3.5–5.0)
Alkaline Phosphatase: 60 U/L (ref 38–126)
Anion gap: 8 (ref 5–15)
BUN: 11 mg/dL (ref 6–20)
CO2: 23 mmol/L (ref 22–32)
Calcium: 8.7 mg/dL — ABNORMAL LOW (ref 8.9–10.3)
Chloride: 107 mmol/L (ref 98–111)
Creatinine, Ser: 0.77 mg/dL (ref 0.44–1.00)
GFR, Estimated: >60 mL/min (ref >60)
Glucose, Bld: 95 mg/dL (ref 70–99)
Potassium: 3.8 mmol/L (ref 3.5–5.1)
Sodium: 138 mmol/L (ref 135–145)
Total Bilirubin: 0.6 mg/dL (ref 0.3–1.2)
Total Protein: 7.2 g/dL (ref 6.5–8.1)

## 2021-02-26 LAB — CBC
HCT: 29.5 % — ABNORMAL LOW (ref 36.0–46.0)
Hemoglobin: 9.4 g/dL — ABNORMAL LOW (ref 12.0–15.0)
MCH: 25 pg — ABNORMAL LOW (ref 26.0–34.0)
MCHC: 31.9 g/dL (ref 30.0–36.0)
MCV: 78.5 fL — ABNORMAL LOW (ref 80.0–100.0)
Platelets: 317 10*3/uL (ref 150–400)
RBC: 3.76 MIL/uL — ABNORMAL LOW (ref 3.87–5.11)
RDW: 16.5 % — ABNORMAL HIGH (ref 11.5–15.5)
WBC: 4.6 10*3/uL (ref 4.0–10.5)
nRBC: 0 % (ref 0.0–0.2)

## 2021-02-26 LAB — HIV ANTIBODY (ROUTINE TESTING W REFLEX): HIV Screen 4th Generation wRfx: NONREACTIVE

## 2021-02-26 LAB — MAGNESIUM: Magnesium: 2.2 mg/dL (ref 1.7–2.4)

## 2021-02-26 MED ORDER — IODIXANOL 320 MG/ML IV SOLN
50.0000 mL | Freq: Once | INTRAVENOUS | Status: AC
  Administered 2021-02-26: 15 mL

## 2021-02-26 MED ORDER — SODIUM CHLORIDE 0.9 % IV BOLUS
1000.0000 mL | Freq: Once | INTRAVENOUS | Status: AC
  Administered 2021-02-26: 1000 mL via INTRAVENOUS

## 2021-02-26 MED ORDER — SODIUM CHLORIDE 0.9 % IV SOLN
2.0000 g | INTRAVENOUS | Status: DC
  Administered 2021-02-26 – 2021-02-27 (×2): 2 g via INTRAVENOUS
  Filled 2021-02-26 (×2): qty 2

## 2021-02-26 MED ORDER — FENTANYL CITRATE (PF) 100 MCG/2ML IJ SOLN
INTRAMUSCULAR | Status: AC
  Filled 2021-02-26: qty 2

## 2021-02-26 MED ORDER — CEFAZOLIN SODIUM-DEXTROSE 2-4 GM/100ML-% IV SOLN
2.0000 g | Freq: Once | INTRAVENOUS | Status: AC
  Administered 2021-02-26: 2 g via INTRAVENOUS
  Filled 2021-02-26: qty 100

## 2021-02-26 MED ORDER — CEFAZOLIN SODIUM-DEXTROSE 2-4 GM/100ML-% IV SOLN
INTRAVENOUS | Status: AC
  Filled 2021-02-26: qty 100

## 2021-02-26 MED ORDER — MIDAZOLAM HCL 5 MG/5ML IJ SOLN
INTRAMUSCULAR | Status: AC
  Filled 2021-02-26: qty 5

## 2021-02-26 NOTE — Consult Note (Signed)
Chief Complaint: Patient was seen in consultation today for left renal calculi  Referring Physician(s): Marlou Porch, MD  Patient Status: [facility] - In-pt  History of Present Illness: Julie Austin is a 40 y.o. female with left flank pain and fevers.  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  Patient has been evaluated by Urology and a percutaneous nephrostomy is recommended.  Patient is feeling better since coming to hospital.  No flank pain at this time.  Past Medical History:  Diagnosis Date  . Blurry vision    wears glasses  . History of anemia   . History of gestational diabetes   . History of urinary tract infection   . Vaginal itching   . Weight gain     Past Surgical History:  Procedure Laterality Date  . LEEP     [facility] (thinks this was procedure as done under general anesthesia due to an abnormal PAP)    Allergies: Patient has no known allergies.  Medications: Prior to Admission medications   Medication Sig Start Date End Date Taking? Authorizing Provider  Ascorbic Acid (VITAMIN C) 100 MG tablet Take 100 mg by mouth daily.   Yes [provider]  BACTRIM DS 800-160 MG tablet Take 1 tablet by mouth 2 (two) times daily. 02/23/21  Yes [provider]  cholecalciferol (VITAMIN D3) 25 MCG (1000 UNIT) tablet Take 1,000 Units by mouth daily.   Yes [provider]  ibuprofen (ADVIL) 200 MG tablet Take 400 mg by mouth every 6 (six) hours as needed for moderate pain or mild pain.   Yes [provider]  Multiple Vitamin (MULTIVITAMIN) tablet Take 1 tablet by mouth daily.   Yes [provider]  azithromycin (ZITHROMAX) 500 MG tablet Take 1 tablet (500 mg total) by mouth daily. Patient not taking: Reported on 02/25/2021 01/11/21   Federico Flake, MD  cephALEXin (KEFLEX) 500 MG capsule Take 500 mg by mouth 3 (three) times daily. Patient not taking: Reported on 02/25/2021 02/09/21   [provider]     Family History  Problem Relation Age of Onset  . Diabetes Paternal Grandfather   . Diabetes Paternal Grandmother   . Diabetes Maternal Grandmother   . Heart disease Maternal Grandmother   . Diabetes Maternal Grandfather   . Diabetes Father   . Migraines Mother   . Kidney disease Son     Social History   Socioeconomic History  . Marital status: Single    Spouse name: Not on file  . Number of children: 7  . Years of education: 29  . Highest education level: Not on file  Occupational History  . Not on file  Tobacco Use  . Smoking status: Never Smoker  . Smokeless tobacco: Never Used  Vaping Use  . Vaping Use: Never used  Substance and Sexual Activity  . Alcohol use: Yes    Alcohol/week: 1.0 standard drink    Types: 1 Glasses of wine per week    Comment: Last ETOH use 12/23/2020 (glass of wine)  . Drug use: Never  . Sexual activity: Yes    Partners: Male    Birth control/protection: I.U.D.    Comment: Paragard inserted 02/14/2015 at [facility]  Other Topics Concern  . Not on file  Social History Narrative   ** Merged History Encounter **       Social Determinants of Health   Financial Resource Strain: Not on file  Food Insecurity: Not on file  Transportation Needs:  Not on file  Physical Activity: Not on file  Stress: Not on file  Social Connections: Not on file    ECOG Status: 1 - Symptomatic but completely ambulatory   Review of Systems  Constitutional: Positive for fever.  Genitourinary: Positive for flank pain.    Vital Signs: BP (!) 97/49   Pulse 81   Temp 97.7 F (36.5 C) (Oral)   Resp 20   Ht 5\' 2"  (1.575 m)   Wt 63.5 kg   SpO2 100%   BMI 25.61 kg/m   Physical Exam Constitutional:      Appearance: She is not ill-appearing.  Cardiovascular:     Rate and Rhythm: Normal rate and regular rhythm.  Pulmonary:     Effort: Pulmonary effort is normal.     Breath sounds: Normal breath sounds.  Abdominal:     Comments: No  significant flank pain.  Neurological:     Mental Status: She is alert.     Imaging: CT Renal Stone Study  Result Date: 02/25/2021 CLINICAL DATA:  Flank pain. EXAM: CT ABDOMEN AND PELVIS WITHOUT CONTRAST TECHNIQUE: Multidetector CT imaging of the abdomen and pelvis was performed following the standard protocol without IV contrast. COMPARISON:  None. FINDINGS: Lower chest: The lung bases are clear. The heart size is normal. Hepatobiliary: The liver is normal. Normal gallbladder.There is no biliary ductal dilation. Pancreas: Normal contours without ductal dilatation. No peripancreatic fluid collection. Spleen: Unremarkable. Adrenals/Urinary Tract: --Adrenal glands: Unremarkable. --Right kidney/ureter: The right kidney is small and somewhat atrophic when compared to the left. There are multiple nonobstructing stones throughout the right kidney consistent with nephrocalcinosis. --Left kidney/ureter: The left kidney is enlarged with moderate hydronephrosis secondary to a large obstructing stone in the renal pelvis measuring approximately 2.9 x 1.7 cm. The proximal mid left ureter is dilated and demonstrates diffuse circumferential wall thickening. --Urinary bladder: Unremarkable. Stomach/Bowel: --Stomach/Duodenum: No hiatal hernia or other gastric abnormality. Normal duodenal course and caliber. --Small bowel: Unremarkable. --Colon: Unremarkable. --Appendix: Normal. Vascular/Lymphatic: Normal course and caliber of the major abdominal vessels. --No retroperitoneal lymphadenopathy. --No mesenteric lymphadenopathy. --No pelvic or inguinal lymphadenopathy. Reproductive: Small uterine fibroids are noted. Other: No ascites or free air. There is a small fat containing umbilical hernia. Musculoskeletal. No acute displaced fractures. IMPRESSION: 1. Moderate left-sided hydronephrosis secondary to a large obstructing stone in the left renal pelvis. 2. Dilated proximal and mid left ureter with associated diffuse  circumferential urothelial wall thickening. Findings may be secondary to obstructing non radiopaque material such as blood clots or debris. Alternatively, findings may be secondary to an ascending urinary tract infection with underlying pyonephrosis. Urologic consultation is recommended. 3. Right-sided nephrocalcinosis without evidence for an obstructing process. Electronically Signed   By: 02/27/2021 M.D.   On: 02/25/2021 16:45    Labs:  CBC: Recent Labs    02/25/21 1434 02/26/21 0522  WBC 7.4 4.6  HGB 9.9* 9.4*  HCT 32.4* 29.5*  PLT 362 317    COAGS: Recent Labs    02/25/21 2204  INR 1.1    BMP: Recent Labs    02/25/21 1434 02/26/21 0522  NA 132* 138  K 4.0 3.8  CL 101 107  CO2 22 23  GLUCOSE 91 95  BUN 10 11  CALCIUM 8.8* 8.7*  CREATININE 0.90 0.77  GFRNONAA >60 >60    LIVER FUNCTION TESTS: Recent Labs    02/26/21 0522  BILITOT 0.6  AST 20  ALT 14  ALKPHOS 60  PROT 7.2  ALBUMIN 3.2*  TUMOR MARKERS: No results for input(s): AFPTM, CEA, CA199, CHROMGRNA in the last 8760 hours.  Assessment and Plan:  40 yo with left staghorn calculi and left hydronephrosis.  Patient has an obstructive left UPJ stone.  Patient has been evaluated by urology and recommends placement of a percutaneous nephrostomy tube.  I reviewed the patient's recent CT and the anatomy is amenable for image guided percutaneous nephrostomy tube placement.  Risks and benefits of left PCN placement was discussed with the patient including, but not limited to, infection, bleeding, significant bleeding causing loss or decrease in renal function or damage to adjacent structures.   All of the patient's questions were answered, patient is agreeable to proceed.  Consent signed and in chart.   Electronically Signed: Arn Medal, MD 02/26/2021, 12:01 PM

## 2021-02-26 NOTE — Consult Note (Signed)
I have been asked to see the patient by Dr. Newton Pigg, for evaluation and management of left stagehorn kidney stone and persistant UTI.  History of present illness: 40 year old otherwise healthy female presented to the emergency department with persistent left-sided flank pain.  The patient had been seen and evaluated for such over the past 3 weeks.  She was started on 2 separate antibiotics because of symptoms of infection.  This did alleviate her pain to some degree, but she continued to have persistent pain.  She is also had intermittent fevers.  She was on her second course of antibiotics, Bactrim, and despite this was having left flank pain with associated nausea vomiting and a reported fever of 103 at home.  But in the ER was noted to have a temperature of 100.1 and tachycardia.  She had no leukocytosis and her lactate was normal.  Renal function was also noted to be normal.  The urinalysis reflected inflammation with possible infection, her blood cultures were no growth to date and her urine cultures are pending.  Since the patient's been admitted and put on IV fluids as well as IV antibiotics she is feeling better.  She is currently not complaining of any pain.  She has been afebrile since yesterday afternoon.  She remains n.p.o. past midnight for pending left neph tube placement  Review of systems: A 12 point comprehensive review of systems was obtained and is negative unless otherwise stated in the history of present illness.  Patient Active Problem List   Diagnosis Date Noted  . Kidney stone on left side 02/25/2021  . Urinary tract infection 02/25/2021  . Sepsis (HCC) 02/25/2021  . Left flank pain 02/25/2021  . Nausea & vomiting 02/25/2021  . Acute hyponatremia 02/25/2021  . Microcytic anemia 02/25/2021  . Overweight BMI=27.9 12/27/2020  . hx of gestational diabetes 2016 12/27/2020  . hx pp depression 2016 12/27/2020    No current facility-administered medications on file  prior to encounter.   Current Outpatient Medications on File Prior to Encounter  Medication Sig Dispense Refill  . Ascorbic Acid (VITAMIN C) 100 MG tablet Take 100 mg by mouth daily.    Marland Kitchen BACTRIM DS 800-160 MG tablet Take 1 tablet by mouth 2 (two) times daily.    . cholecalciferol (VITAMIN D3) 25 MCG (1000 UNIT) tablet Take 1,000 Units by mouth daily.    Marland Kitchen ibuprofen (ADVIL) 200 MG tablet Take 400 mg by mouth every 6 (six) hours as needed for moderate pain or mild pain.    . Multiple Vitamin (MULTIVITAMIN) tablet Take 1 tablet by mouth daily.    Marland Kitchen azithromycin (ZITHROMAX) 500 MG tablet Take 1 tablet (500 mg total) by mouth daily. (Patient not taking: Reported on 02/25/2021) 2 tablet 0  . cephALEXin (KEFLEX) 500 MG capsule Take 500 mg by mouth 3 (three) times daily. (Patient not taking: Reported on 02/25/2021)      Past Medical History:  Diagnosis Date  . Blurry vision    wears glasses  . History of anemia   . History of gestational diabetes   . History of urinary tract infection   . Vaginal itching   . Weight gain     Past Surgical History:  Procedure Laterality Date  . LEEP     [facility] (thinks this was procedure as done under general anesthesia due to an abnormal PAP)    Social History   Tobacco Use  . Smoking status: Never Smoker  . Smokeless tobacco: Never Used  Vaping Use  .  Vaping Use: Never used  Substance Use Topics  . Alcohol use: Yes    Alcohol/week: 1.0 standard drink    Types: 1 Glasses of wine per week    Comment: Last ETOH use 12/23/2020 (glass of wine)  . Drug use: Never    Family History  Problem Relation Age of Onset  . Diabetes Paternal Grandfather   . Diabetes Paternal Grandmother   . Diabetes Maternal Grandmother   . Heart disease Maternal Grandmother   . Diabetes Maternal Grandfather   . Diabetes Father   . Migraines Mother   . Kidney disease Son     PE: Vitals:   02/25/21 2031 02/25/21 2318 02/26/21 0439 02/26/21 0808  BP: 97/60 (!) 102/52  98/65 (!) 89/53  Pulse: 85 74 77 76  Resp: 18 18 18 17   Temp: 98.5 F (36.9 C) 97.9 F (36.6 C) 97.7 F (36.5 C) 97.7 F (36.5 C)  TempSrc: Oral Oral  Oral  SpO2: 100% 99% 100% 96%  Weight:      Height:       Patient appears to be in no acute distress  patient is alert and oriented x3 Atraumatic normocephalic head No cervical or supraclavicular lymphadenopathy appreciated No increased work of breathing, no audible wheezes/rhonchi Regular sinus rhythm/rate Abdomen is soft, nontender, nondistended, no CVA or suprapubic tenderness Lower extremities are symmetric without appreciable edema Grossly neurologically intact No identifiable skin lesions  Recent Labs    02/25/21 1434 02/26/21 0522  WBC 7.4 4.6  HGB 9.9* 9.4*  HCT 32.4* 29.5*   Recent Labs    02/25/21 1434 02/26/21 0522  NA 132* 138  K 4.0 3.8  CL 101 107  CO2 22 23  GLUCOSE 91 95  BUN 10 11  CREATININE 0.90 0.77  CALCIUM 8.8* 8.7*   Recent Labs    02/25/21 2204  INR 1.1   No results for input(s): LABURIN in the last 72 hours. Results for orders placed or performed during the hospital encounter of 02/25/21  Resp Panel by RT-PCR (Flu A&B, Covid) Nasopharyngeal Swab     Status: None   Collection Time: 02/25/21  5:15 PM   Specimen: Nasopharyngeal Swab; Nasopharyngeal(NP) swabs in vial transport medium  Result Value Ref Range Status   SARS Coronavirus 2 by RT PCR NEGATIVE NEGATIVE Final    Comment: (NOTE) SARS-CoV-2 target nucleic acids are NOT DETECTED.  The SARS-CoV-2 RNA is generally detectable in upper respiratory specimens during the acute phase of infection. The lowest concentration of SARS-CoV-2 viral copies this assay can detect is 138 copies/mL. A negative result does not preclude SARS-Cov-2 infection and should not be used as the sole basis for treatment or other patient management decisions. A negative result may occur with  improper specimen collection/handling, submission of specimen  other than nasopharyngeal swab, presence of viral mutation(s) within the areas targeted by this assay, and inadequate number of viral copies(<138 copies/mL). A negative result must be combined with clinical observations, patient history, and epidemiological information. The expected result is Negative.  Fact Sheet for Patients:  02/27/21  Fact Sheet for Healthcare Providers:  BloggerCourse.com  This test is no t yet approved or cleared by the [place] FDA and  has been authorized for detection and/or diagnosis of SARS-CoV-2 by FDA under an Emergency Use Authorization (EUA). This EUA will remain  in effect (meaning this test can be used) for the duration of the COVID-19 declaration under Section 564(b)(1) of the Act, 21 U.S.C.section 360bbb-3(b)(1), unless the authorization is  terminated  or revoked sooner.       Influenza A by PCR NEGATIVE NEGATIVE Final   Influenza B by PCR NEGATIVE NEGATIVE Final    Comment: (NOTE) The Xpert Xpress SARS-CoV-2/FLU/RSV plus assay is intended as an aid in the diagnosis of influenza from Nasopharyngeal swab specimens and should not be used as a sole basis for treatment. Nasal washings and aspirates are unacceptable for Xpert Xpress SARS-CoV-2/FLU/RSV testing.  Fact Sheet for Patients: BloggerCourse.com  Fact Sheet for Healthcare Providers: SeriousBroker.it  This test is not yet approved or cleared by the [place] FDA and has been authorized for detection and/or diagnosis of SARS-CoV-2 by FDA under an Emergency Use Authorization (EUA). This EUA will remain in effect (meaning this test can be used) for the duration of the COVID-19 declaration under Section 564(b)(1) of the Act, 21 U.S.C. section 360bbb-3(b)(1), unless the authorization is terminated or revoked.  Performed at [facility], [address]  [place], [place] [address]   Blood culture (routine x 2)     Status: None (Preliminary result)   Collection Time: 02/25/21  5:15 PM   Specimen: BLOOD  Result Value Ref Range Status   Specimen Description BLOOD BLOOD LEFT HAND  Final   Special Requests   Final    BOTTLES DRAWN AEROBIC AND ANAEROBIC Blood Culture results may not be optimal due to an inadequate volume of blood received in culture bottles   Culture   Final    NO GROWTH < 12 HOURS Performed at [facility], [address], [place], [place] [address]    Report Status PENDING  Incomplete  Blood culture (routine x 2)     Status: None (Preliminary result)   Collection Time: 02/25/21  5:15 PM   Specimen: BLOOD  Result Value Ref Range Status   Specimen Description BLOOD BLOOD RIGHT FOREARM  Final   Special Requests   Final    BOTTLES DRAWN AEROBIC AND ANAEROBIC Blood Culture results may not be optimal due to an inadequate volume of blood received in culture bottles   Culture   Final    NO GROWTH < 12 HOURS Performed at [facility], [address], [place], [place] [address]    Report Status PENDING  Incomplete    Imaging: Independently reviewed the patient's CT scan, stone protocol, demonstrating a large stone in the left UPJ region with hydronephrosis.  There are some minimal stranding in the area as well.  There are no other significant GU abnormalities.  Imp: Patient with a left staghorn kidney stone and incompletely treated UTI.  Fortunately she is not septic and remains hemodynamically stable.  Her pain is well managed at this time.  Recommendations: I went through the treatment options with this patient on an acute basis including left ureteral stent placement versus left nephrostomy tube placement.  Nephrostomy tube was ordered upon admission yesterday.  This is an appropriate intervention as it would decompress her, and over clear her infection as well as treat her pain.  It also will act as renal access  for her upcoming PCNL which will need to be scheduled in the coming days.  She will have to follow-up in the urology clinic early next week for further discussion regarding this.  We will continue to follow this patient.   Crist Fat

## 2021-02-26 NOTE — Progress Notes (Signed)
Pt Axox4. No c/o of pain overnight. NPO since MN. Vitals stable. LR at 16ml/hr. On IV Abx. Pt denied pain all night. Pt independently uses the restroom. Safety measures in place. Will continue to monitor.

## 2021-02-26 NOTE — Sedation Documentation (Signed)
Report called to Tresa Endo, RN of 2C, room 226. Pt. Without any c/o verbalized. Stable for tx back to room

## 2021-02-26 NOTE — Progress Notes (Signed)
Patient was not in her room later this evening and tele box and leads were found on the bed along with her IV which was disconnected.  Unit was searched for patient along with the surrounding area.  RN supervisor made aware and security alerted.  Messages left on the patients voicemail to please return to the hospital.  Dr. Janee Morn was also notified of patient leaving the premises.  [facility] [employer] contacted to go to the patients home as patient had an IV.  Patient returned call to the hospital and said that she was on the way back.  Patients speaks [place] and [place], reached out to [place] interpreter to assist in providing education.  Patient returned to her room and education provided on staying at the hospital and the importance of not leaving and receiving needed antibiotics and care for post nephrostomy tube placement.  Patient verbalized understanding and is in stable condition.  RN supervisor and Dr. Janee Morn aware of patient return.

## 2021-02-26 NOTE — Sedation Documentation (Signed)
Ancef 2 GM IVPB hung per MD orders.

## 2021-02-26 NOTE — Plan of Care (Addendum)
Pt admitted to [facility] via wheelchair on RA with belongings accompanied by a Transporter.  Problem: Education: Goal: Knowledge of General Education information will improve Description: Including pain rating scale, medication(s)/side effects and non-pharmacologic comfort measures Outcome: Progressing   Problem: Health Behavior/Discharge Planning: Goal: Ability to manage health-related needs will improve Outcome: Progressing   Problem: Clinical Measurements: Goal: Ability to maintain clinical measurements within normal limits will improve Outcome: Progressing Goal: Will remain free from infection Outcome: Progressing Goal: Diagnostic test results will improve Outcome: Progressing Goal: Respiratory complications will improve Outcome: Progressing Goal: Cardiovascular complication will be avoided Outcome: Progressing   Problem: Activity: Goal: Risk for activity intolerance will decrease Outcome: Progressing   Problem: Nutrition: Goal: Adequate nutrition will be maintained Outcome: Progressing   Problem: Coping: Goal: Level of anxiety will decrease Outcome: Progressing   Problem: Elimination: Goal: Will not experience complications related to bowel motility Outcome: Progressing Goal: Will not experience complications related to urinary retention Outcome: Progressing   Problem: Pain Managment: Goal: General experience of comfort will improve Outcome: Progressing   Problem: Safety: Goal: Ability to remain free from injury will improve Outcome: Progressing   Problem: Skin Integrity: Goal: Risk for impaired skin integrity will decrease Outcome: Progressing

## 2021-02-26 NOTE — Sedation Documentation (Signed)
Received report from [place], RN on 2C. Pt. AA&Ox3. States min. Pain to LLQ. Await MD arrival.

## 2021-02-26 NOTE — Plan of Care (Signed)
Continuing with plan of care. 

## 2021-02-26 NOTE — Procedures (Signed)
Interventional Radiology Procedure:   Indications: Left hydronephrosis due to obstructive stone disease  Procedure: Left nephrostomy tube placement  Findings: Left renal stones with hydronephrosis.  10 Fr drain placed in lower pole calyx/infundibulum.  Pigtail is positioned near upper pole calyx.  Complications: No immediate     EBL: Less than 10 ml  Plan: Keep drain to gravity bag.  Nephrostomy tube should be evaluated prior to being used for percutaneous nephrolithotomy procedure.    Ronnette Rump R. Lowella Dandy, MD  Pager: [phone]

## 2021-02-26 NOTE — Progress Notes (Signed)
PROGRESS NOTE    Julie Austin  MEQ:[id number] DOB: 06-22-1981 DOA: 02/25/2021 PCP: [facility]    Chief Complaint  Patient presents with  . infection    Brief Narrative:  Patient 40 year old female history of chronic anemia, gestational diabetes presented to ED with obstructing left nephrolithiasis after presenting from home with complaints of ongoing left flank pain.  CT renal stone protocol done showing an enlarged left kidney with moderate hydronephrosis secondary to large obstructing stone in the renal pelvis with proximal and mid left ureteral dilatation.  Patient placed empirically on IV antibiotics, urine cultures obtained, IR consulted for placement of nephrostomy tube.  Urology consulted.   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  Urinalysis done with large leukocytes, > 50 WBCs.  Normal white count. -Blood cultures and urine cultures obtained and pending. -Patient noted to have received 2 rounds of antibiotics with a 10-day course of Keflex as well as a course of Bactrim for presumed UTI prior to admission. -Continue IV Rocephin, placed on IV fluids, pain management, supportive care. -IR consulted for percutaneous nephrostomy placement which will be done today. -Urology consulted and following.  2.  Dehydration/borderline blood pressure -Patient with systolic blood pressure of 89 this morning however asymptomatic.   -1 L bolus normal saline.  Placed on maintenance IV fluids -Continue IV antibiotics.  3.  Iron deficiency anemia -Patient with  no overt bleeding. -Hemoglobin currently stable at 9.4. -Transfusion threshold hemoglobin < 7. -Follow H&H.  4.  Hyponatremia -Likely secondary to hypovolemic hyponatremia. -Improved with hydration. -IV fluids. -Follow.  5.  Nausea/vomiting -Secondary to problem #1 -Improved.   DVT prophylaxis: SCDs Code Status: Full Family Communication: Updated patient and mother at bedside. Disposition:   Status is: Inpatient    Dispo: The patient is from: Home              Anticipated d/c is to: Home              Patient currently awaiting nephrostomy tube placement, on IV antibiotics, urine cultures pending.  Not stable for discharge.   Difficult to place patient no       Consultants:   Interventional radiology  Urology  Procedures:   Percutaneous nephrostomy placement pending  CT renal stone protocol 02/25/2021  Antimicrobials:   IV Rocephin 02/25/2021 >>>>>>   Subjective: Patient laying in bed.  Denies any significant left flank pain.  No nausea or vomiting this morning.  No chest pain.  No shortness of breath.  Dysuria improved.  Awaiting percutaneous nephrostomy tube placement.  Objective: Vitals:   02/25/21 2031 02/25/21 2318 02/26/21 0439 02/26/21 0808  BP: 97/60 (!) 102/52 98/65 (!) 89/53  Pulse: 85 74 77 76  Resp: 18 18 18 17   Temp: 98.5 F (36.9 C) 97.9 F (36.6 C) 97.7 F (36.5 C) 97.7 F (36.5 C)  TempSrc: Oral Oral  Oral  SpO2: 100% 99% 100% 96%  Weight:      Height:        Intake/Output Summary (Last 24 hours) at 02/26/2021 04/28/2021 Last data filed at 02/25/2021 2030 Gross per  24 hour  Intake --  Output 640 ml  Net -640 ml   Filed Weights   02/25/21 1432  Weight: 63.5 kg    Examination:  General exam: Appears calm and comfortable  Respiratory system: Clear to auscultation. Respiratory effort normal. Cardiovascular system: S1 & S2 heard, RRR. No JVD, murmurs, rubs, gallops or clicks. No pedal edema. Gastrointestinal system: Abdomen is  nondistended, soft and tender to palpation in the suprapubic region.  Some left CVA tenderness to palpation.  Positive bowel sounds.  No rebound.  No guarding.  Nontender. No organomegaly or masses felt. Normal bowel sounds heard. Central nervous system: Alert and oriented. No focal neurological deficits. Extremities: Symmetric 5 x 5 power. Skin: No rashes, lesions or ulcers Psychiatry: Judgement and insight appear normal. Mood & affect appropriate.     Data Reviewed: I have personally reviewed following labs and imaging studies  CBC: Recent Labs  Lab 02/25/21 1434 02/26/21 0522  WBC 7.4 4.6  HGB 9.9* 9.4*  HCT 32.4* 29.5*  MCV 79.4* 78.5*  PLT 362 317    Basic Metabolic Panel: Recent Labs  Lab 02/25/21 1434 02/25/21 2018 02/26/21 0522  NA 132*  --  138  K 4.0  --  3.8  CL 101  --  107  CO2 22  --  23  GLUCOSE 91  --  95  BUN 10  --  11  CREATININE 0.90  --  0.77  CALCIUM 8.8*  --  8.7*  MG  --  2.3 2.2    GFR: Estimated Creatinine Clearance: 83.5 mL/min (by C-G formula based on SCr of 0.77 mg/dL).  Liver Function Tests: Recent Labs  Lab 02/26/21 0522  AST 20  ALT 14  ALKPHOS 60  BILITOT 0.6  PROT 7.2  ALBUMIN 3.2*    CBG: No results for input(s): GLUCAP in the last 168 hours.   Recent Results (from the past 240 hour(s))  Resp Panel by RT-PCR (Flu A&B, Covid) Nasopharyngeal Swab     Status: None   Collection Time: 02/25/21  5:15 PM   Specimen: Nasopharyngeal Swab; Nasopharyngeal(NP) swabs in vial transport medium  Result Value Ref Range Status   SARS Coronavirus 2 by RT PCR NEGATIVE NEGATIVE Final    Comment: (NOTE) SARS-CoV-2 target nucleic acids are NOT DETECTED.  The SARS-CoV-2 RNA is generally detectable in upper respiratory specimens during the acute phase of infection. The lowest concentration of SARS-CoV-2 viral copies this assay can detect is 138 copies/mL. A negative result does not preclude SARS-Cov-2 infection and should not be used as  the sole basis for treatment or other patient management decisions. A negative result may occur with  improper specimen collection/handling, submission of specimen other than nasopharyngeal swab, presence of viral mutation(s) within the areas targeted by this assay, and inadequate number of viral copies(<138 copies/mL). A negative result must be combined with clinical observations, patient history, and epidemiological information. The expected result is Negative.  Fact Sheet for Patients:  BloggerCourse.comhttps://www.fda.gov/media/152166/download  Fact Sheet for Healthcare Providers:  SeriousBroker.ithttps://www.fda.gov/media/152162/download  This test is no t yet approved or cleared by the [place] FDA and  has been authorized for detection and/or diagnosis of SARS-CoV-2 by FDA under an Emergency Use Authorization (EUA). This EUA will remain  in effect (meaning this test can be used) for the duration of the COVID-19 declaration under Section 564(b)(1) of the Act, 21 U.S.C.section 360bbb-3(b)(1), unless the authorization is terminated  or revoked sooner.       Influenza A by  PCR NEGATIVE NEGATIVE Final   Influenza B by PCR NEGATIVE NEGATIVE Final    Comment: (NOTE) The Xpert Xpress SARS-CoV-2/FLU/RSV plus assay is intended as an aid in the diagnosis of influenza from Nasopharyngeal swab specimens and should not be used as a sole basis for treatment. Nasal washings and aspirates are unacceptable for Xpert Xpress SARS-CoV-2/FLU/RSV testing.  Fact Sheet for Patients: BloggerCourse.com  Fact Sheet for Healthcare Providers: SeriousBroker.it  This test is not yet approved or cleared by the [place] FDA and has been authorized for detection and/or diagnosis of SARS-CoV-2 by FDA under an Emergency Use Authorization (EUA). This EUA will remain in effect (meaning this test can be used) for the duration of the COVID-19 declaration under Section 564(b)(1) of  the Act, 21 U.S.C. section 360bbb-3(b)(1), unless the authorization is terminated or revoked.  Performed at [facility], [address] [place], [place] [address]   Blood culture (routine x 2)     Status: None (Preliminary result)   Collection Time: 02/25/21  5:15 PM   Specimen: BLOOD  Result Value Ref Range Status   Specimen Description BLOOD BLOOD LEFT HAND  Final   Special Requests   Final    BOTTLES DRAWN AEROBIC AND ANAEROBIC Blood Culture results may not be optimal due to an inadequate volume of blood received in culture bottles   Culture   Final    NO GROWTH < 12 HOURS Performed at [facility], [address] [place], [place] [address]    Report Status PENDING  Incomplete  Blood culture (routine x 2)     Status: None (Preliminary result)   Collection Time: 02/25/21  5:15 PM   Specimen: BLOOD  Result Value Ref Range Status   Specimen Description BLOOD BLOOD RIGHT FOREARM  Final   Special Requests   Final    BOTTLES DRAWN AEROBIC AND ANAEROBIC Blood Culture results may not be optimal due to an inadequate volume of blood received in culture bottles   Culture   Final    NO GROWTH < 12 HOURS Performed at [facility], [address], [place], [place] [address]    Report Status PENDING  Incomplete         Radiology Studies: CT Renal Stone Study  Result Date: 02/25/2021 CLINICAL DATA:  Flank pain. EXAM: CT ABDOMEN AND PELVIS WITHOUT CONTRAST TECHNIQUE: Multidetector CT imaging of the abdomen and pelvis was performed following the standard protocol without IV contrast. COMPARISON:  None. FINDINGS: Lower chest: The lung bases are clear. The heart size is normal. Hepatobiliary: The liver is normal. Normal gallbladder.There is no biliary ductal dilation. Pancreas: Normal contours without ductal dilatation. No peripancreatic fluid collection. Spleen: Unremarkable. Adrenals/Urinary Tract: --Adrenal glands: Unremarkable. --Right kidney/ureter: The  right kidney is small and somewhat atrophic when compared to the left. There are multiple nonobstructing stones throughout the right kidney consistent with nephrocalcinosis. --Left kidney/ureter: The left kidney is enlarged with moderate hydronephrosis secondary to a large obstructing stone in the renal pelvis measuring approximately 2.9 x 1.7 cm. The proximal mid left ureter is dilated and demonstrates diffuse circumferential wall thickening. --Urinary bladder: Unremarkable. Stomach/Bowel: --Stomach/Duodenum: No hiatal hernia or other gastric abnormality. Normal duodenal course and caliber. --Small bowel: Unremarkable. --Colon: Unremarkable. --Appendix: Normal. Vascular/Lymphatic: Normal course and caliber of the major abdominal vessels. --No retroperitoneal lymphadenopathy. --No mesenteric lymphadenopathy. --No pelvic or inguinal lymphadenopathy. Reproductive: Small uterine fibroids are noted. Other: No ascites or free air. There is a small fat containing umbilical hernia. Musculoskeletal.  No acute displaced fractures. IMPRESSION: 1. Moderate left-sided hydronephrosis secondary to a large obstructing stone in the left renal pelvis. 2. Dilated proximal and mid left ureter with associated diffuse circumferential urothelial wall thickening. Findings may be secondary to obstructing non radiopaque material such as blood clots or debris. Alternatively, findings may be secondary to an ascending urinary tract infection with underlying pyonephrosis. Urologic consultation is recommended. 3. Right-sided nephrocalcinosis without evidence for an obstructing process. Electronically Signed   By: Katherine Mantle M.D.   On: 02/25/2021 16:45        Scheduled Meds: Continuous Infusions: . cefTRIAXone (ROCEPHIN)  IV    . lactated ringers 100 mL/hr at 02/25/21 1824  . sodium chloride       LOS: 1 day    Time spent: 35 mins    Ramiro Harvest, MD [facility]   To contact the attending provider between  7A-7P or the covering provider during after hours 7P-7A, please log into the web site [email or web address] and access using universal [facility] password for that web site. If you do not have the password, please call the hospital operator.  02/26/2021, 9:53 AM

## 2021-02-27 DIAGNOSIS — D509 Iron deficiency anemia, unspecified: Secondary | ICD-10-CM

## 2021-02-27 DIAGNOSIS — N202 Calculus of kidney with calculus of ureter: Secondary | ICD-10-CM

## 2021-02-27 DIAGNOSIS — N390 Urinary tract infection, site not specified: Secondary | ICD-10-CM

## 2021-02-27 DIAGNOSIS — R112 Nausea with vomiting, unspecified: Secondary | ICD-10-CM

## 2021-02-27 DIAGNOSIS — N10 Acute tubulo-interstitial nephritis: Secondary | ICD-10-CM | POA: Diagnosis present

## 2021-02-27 LAB — BASIC METABOLIC PANEL
Anion gap: 6 (ref 5–15)
BUN: 10 mg/dL (ref 6–20)
CO2: 25 mmol/L (ref 22–32)
Calcium: 8.8 mg/dL — ABNORMAL LOW (ref 8.9–10.3)
Chloride: 105 mmol/L (ref 98–111)
Creatinine, Ser: 0.74 mg/dL (ref 0.44–1.00)
GFR, Estimated: >60 mL/min (ref >60)
Glucose, Bld: 92 mg/dL (ref 70–99)
Potassium: 4.1 mmol/L (ref 3.5–5.1)
Sodium: 136 mmol/L (ref 135–145)

## 2021-02-27 LAB — URINE CULTURE: Culture: <10000 — AB

## 2021-02-27 LAB — CBC WITH DIFFERENTIAL/PLATELET
Abs Immature Granulocytes: 0.02 10*3/uL (ref 0.00–0.07)
Basophils Absolute: 0 10*3/uL (ref 0.0–0.1)
Basophils Relative: 1 %
Eosinophils Absolute: 0 10*3/uL (ref 0.0–0.5)
Eosinophils Relative: 1 %
HCT: 27.2 % — ABNORMAL LOW (ref 36.0–46.0)
Hemoglobin: 8.5 g/dL — ABNORMAL LOW (ref 12.0–15.0)
Immature Granulocytes: 0 %
Lymphocytes Relative: 27 %
Lymphs Abs: 1.2 10*3/uL (ref 0.7–4.0)
MCH: 24.9 pg — ABNORMAL LOW (ref 26.0–34.0)
MCHC: 31.3 g/dL (ref 30.0–36.0)
MCV: 79.8 fL — ABNORMAL LOW (ref 80.0–100.0)
Monocytes Absolute: 0.4 10*3/uL (ref 0.1–1.0)
Monocytes Relative: 9 %
Neutro Abs: 2.8 10*3/uL (ref 1.7–7.7)
Neutrophils Relative %: 62 %
Platelets: 307 10*3/uL (ref 150–400)
RBC: 3.41 MIL/uL — ABNORMAL LOW (ref 3.87–5.11)
RDW: 16.7 % — ABNORMAL HIGH (ref 11.5–15.5)
WBC: 4.5 10*3/uL (ref 4.0–10.5)
nRBC: 0 % (ref 0.0–0.2)

## 2021-02-27 LAB — MAGNESIUM: Magnesium: 2.2 mg/dL (ref 1.7–2.4)

## 2021-02-27 MED ORDER — CIPROFLOXACIN HCL 500 MG PO TABS: 500.0000 mg | ORAL_TABLET | Freq: Two times a day (BID) | ORAL | 0 refills | Status: AC

## 2021-02-27 MED ORDER — TRAMADOL HCL 50 MG PO TABS: 50.0000 mg | ORAL_TABLET | Freq: Four times a day (QID) | ORAL | 0 refills | Status: DC | PRN

## 2021-02-27 MED ORDER — ONDANSETRON HCL 4 MG PO TABS: 4.0000 mg | ORAL_TABLET | Freq: Every day | ORAL | 0 refills | Status: DC | PRN

## 2021-02-27 NOTE — Discharge Summary (Addendum)
Physician Discharge Summary  Kathlene Yano QQP:[id number] DOB: [birth year] DOA: 02/25/2021  PCP: [facility]  Admit date: 02/25/2021 Discharge date: 02/27/2021  Time spent: 60 minutes  Recommendations for Outpatient Follow-up:  1. Follow-up with Dr. Apolinar Junes urology in 1 to 2 weeks. 2. Follow-up at [facility]/PCP in 2 weeks.  On follow-up patient need a basic metabolic profile done to follow-up on electrolytes and renal function.  Patient will need a CBC done to follow-up on H&H.  Patient's iron deficiency anemia will need to be followed up upon.   Discharge Diagnoses:  Principal Problem:   Sepsis (HCC) Active Problems:   Acute pyelonephritis   Hydronephrosis with urinary obstruction due to ureteral calculus   Kidney stone on left side   Urinary tract infection   Left flank pain   Nausea & vomiting   Acute hyponatremia   Microcytic anemia   Dehydration   Discharge Condition: Stable and improved  Diet recommendation: Regular  Filed Weights   02/25/21 1432  Weight: 63.5 kg    History of present illness:  HPI per Dr. Wynonia Hazard Rona Ravens is a 40 y.o. female with medical history significant for chronic anemia, gestational diabetes, who is admitted to [facility] on 02/25/2021 with obstructing left nephrolithiasis after presenting from home to [facility] ED complaining of left flank pain.   The patient reports that she has been experiencing left flank pain for a little over 2 weeks, with symptoms starting on 02/09/2021, and associated with dysuria in the absence of gross hematuria or change in urinary urgency/frequency.  Shortly after onset of left flank pain, she was diagnosed with a urinary tract infection by her PCP, who subsequently initiated Keflex 500 mg p.o. 3 times daily for 10-day course.  The patient compliantly completed the totality of this 10-day course, and reported a slight improvement in her  discomfort and dysuria over that timeframe.  However, within 1 to 2 days of completing the Keflex course, she notes that she developed an objective fever, with a temperature of 103 at home, associated with worsening left flank discomfort , intermittent nausea resulting in 4-5 episodes of nonbloody, nonbilious emesis over the last 2 to 3 days, as well as return of her dysuria.  Given the worsening of her symptoms, the patient's PCP started her on Bactrim a few days ago, however the patient noted no ensuing improvement in the above symptoms while taking this antibiotic.  In order to try to manage her left flank discomfort as an outpatient with the patient reports that she has been taking as needed ibuprofen only, without any opioid intervention.  She reports suboptimal left flank pain control with as needed ibuprofen at home, and continues to experience intermittent nausea/vomiting as well as dysuria and subjective fever, prompting her to present to [facility] ED today for further evaluation and management.  Aside from her reported objective fever, with a temperature max of 103 over the last few days, she denies any associated chills, full body rigors, or generalized myalgias.  Most recent episode of nausea/vomiting occurred shortly prior to presenting to [facility] ED today.  Denies any associated diarrhea, melena, hematochezia, or rash.  She denies any known prior history of kidney stones.  No recent trauma or travel.  Denies any recent headache, neck stiffness, rhinitis, rhinorrhea, sore throat, shortness of breath, wheezing, cough.  No recent COVID-19 exposures.  She denies any history of underlying coronary artery disease or known congestive heart failure.  She denies  any recent chest pain, diaphoresis, palpitations, dizziness, presyncope, or syncope.  She also denies any recent orthopnea, PND, or peripheral edema.  She confirms that she is on no blood thinners at home, including no aspirin.     ED Course:   Vital signs in the ED were notable for the following: Temperature max 100.1; HR 94-108 following interval IV fluids, as further described below; respiratory rate 14-18; blood pressure 105/59 -112/70; oxygen saturation 98 to 100% on room air.  Labs were notable for the following: BMP was notable for the following: Sodium 132 compared to only other prior serum sodium value of 138 in 2013, potassium 4.0, bicarbonate 22, anion gap 9, BUN 10, creatinine 0.90 relative to only the prior serum creatinine data point of 1.02 in 2013.  CBC notable for white blood cell count of 7400, hemoglobin 9.9 without prior hemoglobin data point in our EMR for point comparison, with presenting hemoglobin noted to be associated with MCV 79, MCHC 30, and elevated RDW of 16.3.  Platelets 362.  INR has been checked, with result currently pending.  Qualitative urine pregnancy test was found to be negative.  Urinalysis showed greater than 50 white blood cells and large leukocyte Estrace.  Nasopharyngeal COVID-19/influenza PCR was performed in the ED today and found to be positive.  Blood cultures x2 and urine sample was sent for culture prior to initiation of antibiotics.  CT renal stone study showed that the left kidney is enlarged with moderate hydronephrosis secondary to a large obstructing stone in the renal pelvis measuring approximately 2.9 x 1.7 cm, with proximal mid left ureter dilation and demonstrating diffuse circumferential wall thickening.  In the setting of the large nature of the patient's obstructing stone, the case and imaging were discussed with the on-call interventional radiologist, Dr.Mir, who will place a percutaneous nephrostomy tube tomorrow morning (02/26/21).  He requests that the patient be made n.p.o. after midnight in anticipation of this procedure.  While in the ED, the following were administered: Rocephin 1 g IV x1, Tylenol 650 mg p.o. x1.    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  Urinalysis done with large leukocytes, > 50 WBCs.  Normal white count. -Blood cultures with no growth to date and pending at time of discharge  -Urine cultures with insignificant growth. -Patient noted to have received 2 rounds of antibiotics with a 10-day course of Keflex as well as a course of Bactrim for presumed UTI prior to admission. -Patient maintained on IV Rocephin, IV fluids, pain management, supportive care during the hospitalization.  Patient improved clinically.  -IR consulted for percutaneous nephrostomy placement which was done on 02/26/2021 without any complications. -Urology was consulted and followed the patient throughout the hospitalization. -Urology cleared patient for discharge from a urological perspective with plans for follow-up stone management.  -Patient be discharged on 2-week course of oral ciprofloxacin and will follow up with urology in the outpatient setting.  -Patient will be discharged with percutaneous nephrostomies.  Management of percutaneous nephrostomies per urology in the outpatient setting.  -Patient will be discharged in stable and improved condition.  2.  Dehydration/borderline blood pressure -Patient with systolic blood pressure of high 80s to 90s during the hospitalization however remained asymptomatic  -Patient hydrated with IV fluids with improvement with blood pressure  -Patient tolerated oral intake.   -Patient maintained on IV antibiotics  during the hospitalization.   -Dehydration had resolved by day of discharge.   -Outpatient follow-up.    3.  Iron deficiency anemia -Patient with no overt bleeding. -Hemoglobin was stable at 8.5 by day of discharge.   -Outpatient follow-up with PCP.   -May benefit from oral iron supplementation on  follow-up however will defer to PCP.  4.  Hyponatremia -Likely secondary to hypovolemic hyponatremia. -Resolved with hydration  -Outpatient follow-up.   5.  Nausea/vomiting -Secondary to problem #1 -Resolved  Procedures:  Percutaneous nephrostomy placement pending  CT renal stone protocol 02/25/2021   Consultations:  Interventional radiology  Urology   Discharge Exam: Vitals:   02/27/21 1203 02/27/21 1531  BP: (!) 108/57 96/65  Pulse: 88 80  Resp: 18 18  Temp: 97.9 F (36.6 C) 97.9 F (36.6 C)  SpO2: 99% 100%    General: NAD Cardiovascular: RRR Respiratory: CTAB GI: Abdomen soft, nontender, nondistended, positive bowel sounds.  Percutaneous drain left flank region with clear urine.  Discharge Instructions   Discharge Instructions    Diet general   Complete by: As directed    Discharge wound care:   Complete by: As directed    As above and per IR   Discharge wound care:   Complete by: As directed    As above   Increase activity slowly   Complete by: As directed      Allergies as of 02/27/2021   No Known Allergies     Medication List    STOP taking these medications   azithromycin 500 MG tablet Commonly known as: Zithromax   Bactrim DS 800-160 MG tablet Generic drug: sulfamethoxazole-trimethoprim   cephALEXin 500 MG capsule Commonly known as: KEFLEX     TAKE these medications   cholecalciferol 25 MCG (1000 UNIT) tablet Commonly known as: VITAMIN D3 Take 1,000 Units by mouth daily.   ciprofloxacin 500 MG tablet Commonly known as: CIPRO Take 1 tablet (500 mg total) by mouth 2 (two) times daily for 14 days.   ibuprofen 200 MG tablet Commonly known as: ADVIL Take 400 mg by mouth every 6 (six) hours as needed for moderate pain or mild pain.   multivitamin tablet Take 1 tablet by mouth daily.   ondansetron 4 MG tablet Commonly known as: Zofran Take 1 tablet (4 mg total) by mouth daily as needed for nausea or vomiting.   traMADol  50 MG tablet Commonly known as: Ultram Take 1 tablet (50 mg total) by mouth every 6 (six) hours as needed.   vitamin C 100 MG tablet Take 100 mg by mouth daily.            Discharge Care Instructions  (From admission, onward)         Start     Ordered   02/27/21 0000  Discharge wound care:       Comments: As above and per IR   02/27/21 1531   02/27/21 0000  Discharge wound care:       Comments: As above   02/27/21 1546         No Known Allergies  Follow-up Information    [facility] Go on 03/14/2021.   Why: Go at 1:40pm. Contact information: [address] [place] [address] [phone]        [name], MD. Schedule an appointment as soon as possible for a visit in 1 week(s).   Specialty: Urology Why: f/u in 1-2 weeks Contact information: [address] [place]  [place] [address] [phone]                The results of significant diagnostics from this hospitalization (including imaging, microbiology, ancillary and laboratory) are listed below for reference.    Significant Diagnostic Studies: CT Renal Stone Study  Result Date: 02/25/2021 CLINICAL DATA:  Flank pain. EXAM: CT ABDOMEN AND PELVIS WITHOUT CONTRAST TECHNIQUE: Multidetector CT imaging of the abdomen and pelvis was performed following the standard protocol without IV contrast. COMPARISON:  None. FINDINGS: Lower chest: The lung bases are clear. The heart size is normal. Hepatobiliary: The liver is normal. Normal gallbladder.There is no biliary ductal dilation. Pancreas: Normal contours without ductal dilatation. No peripancreatic fluid collection. Spleen: Unremarkable. Adrenals/Urinary Tract: --Adrenal glands: Unremarkable. --Right kidney/ureter: The right kidney is small and somewhat atrophic when compared to the left. There are multiple nonobstructing stones throughout the right kidney consistent with nephrocalcinosis. --Left kidney/ureter: The  left kidney is enlarged with moderate hydronephrosis secondary to a large obstructing stone in the renal pelvis measuring approximately 2.9 x 1.7 cm. The proximal mid left ureter is dilated and demonstrates diffuse circumferential wall thickening. --Urinary bladder: Unremarkable. Stomach/Bowel: --Stomach/Duodenum: No hiatal hernia or other gastric abnormality. Normal duodenal course and caliber. --Small bowel: Unremarkable. --Colon: Unremarkable. --Appendix: Normal. Vascular/Lymphatic: Normal course and caliber of the major abdominal vessels. --No retroperitoneal lymphadenopathy. --No mesenteric lymphadenopathy. --No pelvic or inguinal lymphadenopathy. Reproductive: Small uterine fibroids are noted. Other: No ascites or free air. There is a small fat containing umbilical hernia. Musculoskeletal. No acute displaced fractures. IMPRESSION: 1. Moderate left-sided hydronephrosis secondary to a large obstructing stone in the left renal pelvis. 2. Dilated proximal and mid left ureter with associated diffuse circumferential urothelial wall thickening. Findings may be secondary to obstructing non radiopaque material such as blood clots or debris. Alternatively, findings may be secondary to an ascending urinary tract infection with underlying pyonephrosis. Urologic consultation is recommended. 3. Right-sided nephrocalcinosis without evidence for an obstructing process. Electronically Signed   By: Katherine Mantle M.D.   On: 02/25/2021 16:45   IR NEPHROSTOMY PLACEMENT LEFT  Result Date: 02/26/2021 INDICATION: 40 year old with obstructive left renal calculi. Left hydronephrosis with intermittent fevers. EXAM: PLACEMENT OF LEFT PERCUTANEOUS NEPHROSTOMY TUBE COMPARISON:  CT 02/26/2019 MEDICATIONS: Ancef 2 g; The antibiotic was administered in an appropriate time frame prior to skin puncture. ANESTHESIA/SEDATION: Fentanyl 25 mcg IV; Versed 1.0 mg IV Moderate Sedation Time:  20 minutes The patient was continuously monitored  during the procedure by the interventional radiology nurse under my direct supervision. CONTRAST:  15 mL-administered into the collecting system(s) FLUOROSCOPY TIME:  Fluoroscopy Time: 2 minutes, 54 seconds, 12.8 mGy COMPLICATIONS: None immediate. PROCEDURE: Informed written consent was obtained from the patient after a thorough discussion of the procedural risks, benefits and alternatives. All questions were addressed. Maximal Sterile Barrier Technique was utilized including caps, mask, sterile gowns, sterile gloves, sterile drape, hand hygiene and skin antiseptic. A timeout was performed prior to the initiation of the procedure. Patient was placed prone. Left flank was prepped and draped in sterile fashion. Ultrasound demonstrated left hydronephrosis and echogenic left renal stones. Skin was anesthetized using 1% lidocaine. Small incision was made. Using ultrasound guidance, 21 gauge needle was directed into the lower pole collecting system just distal to an infundibular stone. Contrast injection confirmed placement in the renal collecting system. A 0.018 wire was easily advanced into the renal pelvis. A transitional dilator set was placed. Bentson wire was advanced into the upper pole of the left kidney. Tract was  dilated to accommodate a 10 [place]French multipurpose drain. Due to the large renal pelvic stone, the pigtail catheter was placed in the upper pole collecting system. Contrast injection confirmed placement and catheter was attached to a gravity bag. Drain was secured to the skin with suture. FINDINGS: Large stone in the left renal pelvis and another prominent stone involving a lower pole infundibulum. Needle was directed into collecting system just distal to the infundibular stone. Not clear if the needle was placed within the infundibulum or a small calyx. 10 French drain was placed with the pigtail in the upper pole. IMPRESSION: 1. Successful placement of a left percutaneous nephrostomy tube. 2. This  percutaneous access could potentially be used for a percutaneous nephrolithotomy procedure but not clear if the drain is entering through a calyx versus distal aspect of an infundibulum. Electronically Signed   By: Richarda OverlieAdam  Henn M.D.   On: 02/26/2021 16:59    Microbiology: Recent Results (from the past 240 hour(s))  Urine C&S     Status: Abnormal   Collection Time: 02/25/21  2:34 PM   Specimen: Urine, Random  Result Value Ref Range Status   Specimen Description   Final    URINE, RANDOM Performed at [facility], [address] [place], [place] [address]    Special Requests   Final    NONE Performed at [facility], [address] [place], [place] [address]    Culture (A)  Final    <10,000 COLONIES/mL INSIGNIFICANT GROWTH Performed at [facility], [address] [place], [place] [address]    Report Status 02/27/2021 FINAL  Final  Resp Panel by RT-PCR (Flu A&B, Covid) Nasopharyngeal Swab     Status: None   Collection Time: 02/25/21  5:15 PM   Specimen: Nasopharyngeal Swab; Nasopharyngeal(NP) swabs in vial transport medium  Result Value Ref Range Status   SARS Coronavirus 2 by RT PCR NEGATIVE NEGATIVE Final    Comment: (NOTE) SARS-CoV-2 target nucleic acids are NOT DETECTED.  The SARS-CoV-2 RNA is generally detectable in upper respiratory specimens during the acute phase of infection. The lowest concentration of SARS-CoV-2 viral copies this assay can detect is 138 copies/mL. A negative result does not preclude SARS-Cov-2 infection and should not be used as the sole basis for treatment or other patient management decisions. A negative result may occur with  improper specimen collection/handling, submission of specimen other than nasopharyngeal swab, presence of viral mutation(s) within the areas targeted by this assay, and inadequate number of viral copies(<138 copies/mL). A negative result must be combined with clinical observations, patient history,  and epidemiological information. The expected result is Negative.  Fact Sheet for Patients:  BloggerCourse.comhttps://www.fda.gov/media/152166/download  Fact Sheet for Healthcare Providers:  SeriousBroker.ithttps://www.fda.gov/media/152162/download  This test is no t yet approved or cleared by the [place] FDA and  has been authorized for detection and/or diagnosis of SARS-CoV-2 by FDA under an Emergency Use Authorization (EUA). This EUA will remain  in effect (meaning this test can be used) for the duration of the COVID-19 declaration under Section 564(b)(1) of the Act, 21 U.S.C.section 360bbb-3(b)(1), unless the authorization is terminated  or revoked sooner.       Influenza A by PCR NEGATIVE NEGATIVE Final   Influenza B by PCR NEGATIVE NEGATIVE Final    Comment: (NOTE) The Xpert Xpress SARS-CoV-2/FLU/RSV plus assay is intended as an aid in the diagnosis of influenza from Nasopharyngeal swab specimens and should not be used as a sole basis for treatment. Nasal washings  and aspirates are unacceptable for Xpert Xpress SARS-CoV-2/FLU/RSV testing.  Fact Sheet for Patients: BloggerCourse.com  Fact Sheet for Healthcare Providers: SeriousBroker.it  This test is not yet approved or cleared by the [place] FDA and has been authorized for detection and/or diagnosis of SARS-CoV-2 by FDA under an Emergency Use Authorization (EUA). This EUA will remain in effect (meaning this test can be used) for the duration of the COVID-19 declaration under Section 564(b)(1) of the Act, 21 U.S.C. section 360bbb-3(b)(1), unless the authorization is terminated or revoked.  Performed at [facility], [address] [place], [place] [address]   Blood culture (routine x 2)     Status: None (Preliminary result)   Collection Time: 02/25/21  5:15 PM   Specimen: BLOOD  Result Value Ref Range Status   Specimen Description BLOOD BLOOD LEFT HAND  Final   Special  Requests   Final    BOTTLES DRAWN AEROBIC AND ANAEROBIC Blood Culture results may not be optimal due to an inadequate volume of blood received in culture bottles   Culture   Final    NO GROWTH 2 DAYS Performed at [facility], [address], [place], [place] [address]    Report Status PENDING  Incomplete  Blood culture (routine x 2)     Status: None (Preliminary result)   Collection Time: 02/25/21  5:15 PM   Specimen: BLOOD  Result Value Ref Range Status   Specimen Description BLOOD BLOOD RIGHT FOREARM  Final   Special Requests   Final    BOTTLES DRAWN AEROBIC AND ANAEROBIC Blood Culture results may not be optimal due to an inadequate volume of blood received in culture bottles   Culture   Final    NO GROWTH 2 DAYS Performed at [facility], [address] [place], [place] [address]    Report Status PENDING  Incomplete     Labs: Basic Metabolic Panel: Recent Labs  Lab 02/25/21 1434 02/25/21 2018 02/26/21 0522 02/27/21 0427  NA 132*  --  138 136  K 4.0  --  3.8 4.1  CL 101  --  107 105  CO2 22  --  23 25  GLUCOSE 91  --  95 92  BUN 10  --  11 10  CREATININE 0.90  --  0.77 0.74  CALCIUM 8.8*  --  8.7* 8.8*  MG  --  2.3 2.2 2.2   Liver Function Tests: Recent Labs  Lab 02/26/21 0522  AST 20  ALT 14  ALKPHOS 60  BILITOT 0.6  PROT 7.2  ALBUMIN 3.2*   No results for input(s): LIPASE, AMYLASE in the last 168 hours. No results for input(s): AMMONIA in the last 168 hours. CBC: Recent Labs  Lab 02/25/21 1434 02/26/21 0522 02/27/21 0427  WBC 7.4 4.6 4.5  NEUTROABS  --   --  2.8  HGB 9.9* 9.4* 8.5*  HCT 32.4* 29.5* 27.2*  MCV 79.4* 78.5* 79.8*  PLT 362 317 307   Cardiac Enzymes: No results for input(s): CKTOTAL, CKMB, CKMBINDEX, TROPONINI in the last 168 hours. BNP: BNP (last 3 results) No results for input(s): BNP in the last 8760 hours.  ProBNP (last 3 results) No results for input(s): PROBNP in the last 8760 hours.  CBG: No  results for input(s): GLUCAP in the last 168 hours.     Signed:  Ramiro Harvest MD.  [facility] 02/27/2021, 4:03 PM

## 2021-02-27 NOTE — Progress Notes (Signed)
Urology Inpatient Progress Note  Subjective: No acute events overnight. She is afebrile, VSS. Creatinine stable today, 0.74.  WBC count stable, 4.5.  Blood cultures pending with no growth at 2 days.  Urine culture with insignificant growth.  On antibiotics as below. Left PCN in place draining clear, yellow urine. Patient reports feeling well today without pain.  Anti-infectives: Anti-infectives (From admission, onward)   Start     Dose/Rate Route Frequency Ordered Stop   02/26/21 1500  cefTRIAXone (ROCEPHIN) 1 g in sodium chloride 0.9 % 100 mL IVPB  Status:  Discontinued        1 g 200 mL/hr over 30 Minutes Intravenous Every 24 hours 02/25/21 1918 02/26/21 0920   02/26/21 1500  cefTRIAXone (ROCEPHIN) 2 g in sodium chloride 0.9 % 100 mL IVPB        2 g 200 mL/hr over 30 Minutes Intravenous Every 24 hours 02/26/21 0920     02/26/21 1315  ceFAZolin (ANCEF) IVPB 2g/100 mL premix        2 g 200 mL/hr over 30 Minutes Intravenous  Once 02/26/21 1220 02/26/21 1235   02/25/21 1630  cefTRIAXone (ROCEPHIN) 1 g in sodium chloride 0.9 % 100 mL IVPB        1 g 200 mL/hr over 30 Minutes Intravenous  Once 02/25/21 1621 02/25/21 1805      Current Facility-Administered Medications  Medication Dose Route Frequency Provider Last Rate Last Admin  . acetaminophen (TYLENOL) tablet 650 mg  650 mg Oral Q6H PRN Howerter, Justin B, DO   650 mg at 02/25/21 1831   Or  . acetaminophen (TYLENOL) suppository 650 mg  650 mg Rectal Q6H PRN Howerter, Justin B, DO      . cefTRIAXone (ROCEPHIN) 2 g in sodium chloride 0.9 % 100 mL IVPB  2 g Intravenous Q24H Rodolph Bong, MD 200 mL/hr at 02/26/21 1944 2 g at 02/26/21 1944  . HYDROmorphone (DILAUDID) injection 0.5 mg  0.5 mg Intravenous Q2H PRN Howerter, Justin B, DO      . ketorolac (TORADOL) 15 MG/ML injection 15 mg  15 mg Intravenous Q6H PRN Howerter, Justin B, DO   15 mg at 02/27/21 0428  . lactated ringers infusion   Intravenous Continuous Rodolph Bong,  MD 125 mL/hr at 02/27/21 0615 Infusion Verify at 02/27/21 0615  . naloxone [facility]) injection 0.4 mg  0.4 mg Intravenous PRN Howerter, Justin B, DO      . ondansetron (ZOFRAN) injection 4 mg  4 mg Intravenous Q6H PRN Howerter, Justin B, DO       Objective: Vital signs in last 24 hours: Temp:  [98.1 F (36.7 C)-98.6 F (37 C)] 98.6 F (37 C) (05/02 0405) Pulse Rate:  [75-98] 90 (05/02 0405) Resp:  [16-20] 20 (05/02 0405) BP: (92-108)/(46-60) 103/59 (05/02 0405) SpO2:  [99 %-100 %] 100 % (05/02 0405)  Intake/Output from previous day: 05/01 0701 - 05/02 0700 In: 2773.8 [I.V.:2000; IV Piggyback:773.8] Out: 1450 [Drains:1450] Intake/Output this shift: No intake/output data recorded.  Physical Exam Vitals and nursing note reviewed.  Constitutional:      General: She is not in acute distress.    Appearance: She is not ill-appearing, toxic-appearing or diaphoretic.  HENT:     Head: Normocephalic and atraumatic.  Pulmonary:     Effort: Pulmonary effort is normal. No respiratory distress.  Skin:    General: Skin is warm and dry.  Neurological:     Mental Status: She is alert and oriented to person, place,  and time.  Psychiatric:        Mood and Affect: Mood normal.        Behavior: Behavior normal.    Lab Results:  Recent Labs    02/26/21 0522 02/27/21 0427  WBC 4.6 4.5  HGB 9.4* 8.5*  HCT 29.5* 27.2*  PLT 317 307   BMET Recent Labs    02/26/21 0522 02/27/21 0427  NA 138 136  K 3.8 4.1  CL 107 105  CO2 23 25  GLUCOSE 95 92  BUN 11 10  CREATININE 0.77 0.74  CALCIUM 8.7* 8.8*   PT/INR Recent Labs    02/25/21 2204  LABPROT 14.6  INR 1.1   Assessment & Plan: 40 year old female with a left staghorn kidney stone and persistent UTI, now s/p left PCN placement on empiric antibiotics.  Patient clinically improving and reports feeling better.  I explained that she will require PCN until she undergoes follow-up PCNL in 2 to 3 weeks for management of her left renal  stone.  I explained that we would expect to keep her in the hospital overnight x1 night following that procedure.  She expressed understanding.  Okay for discharge from the urologic perspective with plans for follow-up stone management as above.  Recommend 10 days of empiric antibiotics for management of persistent UTI now the PCN is in place.  Carman Ching, PA-C 02/27/2021

## 2021-02-27 NOTE — Progress Notes (Signed)
Referring Physician(s): Dr. Dub Amis  Supervising Physician: Simonne Come  Patient Status:  [facility] - In-pt  Chief Complaint:  Left flank pain. Found to have staghorn calculi with left sided obstruction and hydronephrosis. IR placed a left sided nephrostomy tube on 5.1.22 by Dr. Jacqualine Code  Subjective:  Currently without any significant complaints. Patient alert and laying in bed, calm and comfortable. RN at bedside. Reports a resolution in left sided flank pain since placement of the left sided nephrostomy tube. Denies any fevers, headache, chest pain, SOB, cough, abdominal pain, nausea, vomiting or bleeding.    Allergies: Patient has no known allergies.  Medications: Prior to Admission medications   Medication Sig Start Date End Date Taking? Authorizing Provider  Ascorbic Acid (VITAMIN C) 100 MG tablet Take 100 mg by mouth daily.   Yes [provider]  BACTRIM DS 800-160 MG tablet Take 1 tablet by mouth 2 (two) times daily. 02/23/21  Yes [provider]  cholecalciferol (VITAMIN D3) 25 MCG (1000 UNIT) tablet Take 1,000 Units by mouth daily.   Yes [provider]  ibuprofen (ADVIL) 200 MG tablet Take 400 mg by mouth every 6 (six) hours as needed for moderate pain or mild pain.   Yes [provider]  Multiple Vitamin (MULTIVITAMIN) tablet Take 1 tablet by mouth daily.   Yes [provider]  azithromycin (ZITHROMAX) 500 MG tablet Take 1 tablet (500 mg total) by mouth daily. Patient not taking: Reported on 02/25/2021 01/11/21   Federico Flake, MD  cephALEXin (KEFLEX) 500 MG capsule Take 500 mg by mouth 3 (three) times daily. Patient not taking: Reported on 02/25/2021 02/09/21   [provider]     Vital Signs: BP (!) 90/55   Pulse 90   Temp 97.8 F (36.6 C)   Resp 18   Ht 5\' 2"  (1.575 m)   Wt 140 lb (63.5 kg)   SpO2 100%   BMI 25.61 kg/m   Physical Exam Vitals and nursing note reviewed.  Constitutional:       Appearance: She is well-developed.  HENT:     Head: Normocephalic and atraumatic.  Eyes:     Conjunctiva/sclera: Conjunctivae normal.  Pulmonary:     Effort: Pulmonary effort is normal.  Genitourinary:    Comments: Left sided nephrostomy bag to gravity bag. Output is clear yellow urine. Musculoskeletal:        General: Normal range of motion.     Cervical back: Normal range of motion.  Skin:    General: Skin is warm.  Neurological:     Mental Status: She is alert and oriented to person, place, and time.     Imaging: CT Renal Stone Study  Result Date: 02/25/2021 CLINICAL DATA:  Flank pain. EXAM: CT ABDOMEN AND PELVIS WITHOUT CONTRAST TECHNIQUE: Multidetector CT imaging of the abdomen and pelvis was performed following the standard protocol without IV contrast. COMPARISON:  None. FINDINGS: Lower chest: The lung bases are clear. The heart size is normal. Hepatobiliary: The liver is normal. Normal gallbladder.There is no biliary ductal dilation. Pancreas: Normal contours without ductal dilatation. No peripancreatic fluid collection. Spleen: Unremarkable. Adrenals/Urinary Tract: --Adrenal glands: Unremarkable. --Right kidney/ureter: The right kidney is small and somewhat atrophic when compared to the left. There are multiple nonobstructing stones throughout the right kidney consistent with nephrocalcinosis. --Left kidney/ureter: The left kidney is enlarged with moderate hydronephrosis secondary to a large obstructing stone in the renal pelvis measuring approximately 2.9 x 1.7 cm. The proximal mid left  ureter is dilated and demonstrates diffuse circumferential wall thickening. --Urinary bladder: Unremarkable. Stomach/Bowel: --Stomach/Duodenum: No hiatal hernia or other gastric abnormality. Normal duodenal course and caliber. --Small bowel: Unremarkable. --Colon: Unremarkable. --Appendix: Normal. Vascular/Lymphatic: Normal course and caliber of the major abdominal vessels. --No retroperitoneal  lymphadenopathy. --No mesenteric lymphadenopathy. --No pelvic or inguinal lymphadenopathy. Reproductive: Small uterine fibroids are noted. Other: No ascites or free air. There is a small fat containing umbilical hernia. Musculoskeletal. No acute displaced fractures. IMPRESSION: 1. Moderate left-sided hydronephrosis secondary to a large obstructing stone in the left renal pelvis. 2. Dilated proximal and mid left ureter with associated diffuse circumferential urothelial wall thickening. Findings may be secondary to obstructing non radiopaque material such as blood clots or debris. Alternatively, findings may be secondary to an ascending urinary tract infection with underlying pyonephrosis. Urologic consultation is recommended. 3. Right-sided nephrocalcinosis without evidence for an obstructing process. Electronically Signed   By: Katherine Mantle M.D.   On: 02/25/2021 16:45   IR NEPHROSTOMY PLACEMENT LEFT  Result Date: 02/26/2021 INDICATION: 40 year old with obstructive left renal calculi. Left hydronephrosis with intermittent fevers. EXAM: PLACEMENT OF LEFT PERCUTANEOUS NEPHROSTOMY TUBE COMPARISON:  CT 02/26/2019 MEDICATIONS: Ancef 2 g; The antibiotic was administered in an appropriate time frame prior to skin puncture. ANESTHESIA/SEDATION: Fentanyl 25 mcg IV; Versed 1.0 mg IV Moderate Sedation Time:  20 minutes The patient was continuously monitored during the procedure by the interventional radiology nurse under my direct supervision. CONTRAST:  15 mL-administered into the collecting system(s) FLUOROSCOPY TIME:  Fluoroscopy Time: 2 minutes, 54 seconds, 12.8 mGy COMPLICATIONS: None immediate. PROCEDURE: Informed written consent was obtained from the patient after a thorough discussion of the procedural risks, benefits and alternatives. All questions were addressed. Maximal Sterile Barrier Technique was utilized including caps, mask, sterile gowns, sterile gloves, sterile drape, hand hygiene and skin antiseptic.  A timeout was performed prior to the initiation of the procedure. Patient was placed prone. Left flank was prepped and draped in sterile fashion. Ultrasound demonstrated left hydronephrosis and echogenic left renal stones. Skin was anesthetized using 1% lidocaine. Small incision was made. Using ultrasound guidance, 21 gauge needle was directed into the lower pole collecting system just distal to an infundibular stone. Contrast injection confirmed placement in the renal collecting system. A 0.018 wire was easily advanced into the renal pelvis. A transitional dilator set was placed. Bentson wire was advanced into the upper pole of the left kidney. Tract was dilated to accommodate a 10 [place] multipurpose drain. Due to the large renal pelvic stone, the pigtail catheter was placed in the upper pole collecting system. Contrast injection confirmed placement and catheter was attached to a gravity bag. Drain was secured to the skin with suture. FINDINGS: Large stone in the left renal pelvis and another prominent stone involving a lower pole infundibulum. Needle was directed into collecting system just distal to the infundibular stone. Not clear if the needle was placed within the infundibulum or a small calyx. 10 French drain was placed with the pigtail in the upper pole. IMPRESSION: 1. Successful placement of a left percutaneous nephrostomy tube. 2. This percutaneous access could potentially be used for a percutaneous nephrolithotomy procedure but not clear if the drain is entering through a calyx versus distal aspect of an infundibulum. Electronically Signed   By: Richarda Overlie M.D.   On: 02/26/2021 16:59    Labs:  CBC: Recent Labs    02/25/21 1434 02/26/21 0522 02/27/21 0427  WBC 7.4 4.6 4.5  HGB 9.9* 9.4* 8.5*  HCT 32.4* 29.5* 27.2*  PLT 362 317 307    COAGS: Recent Labs    02/25/21 2204  INR 1.1    BMP: Recent Labs    02/25/21 1434 02/26/21 0522 02/27/21 0427  NA 132* 138 136  K 4.0 3.8 4.1   CL 101 107 105  CO2 22 23 25   GLUCOSE 91 95 92  BUN 10 11 10   CALCIUM 8.8* 8.7* 8.8*  CREATININE 0.90 0.77 0.74  GFRNONAA >60 >60 >60    LIVER FUNCTION TESTS: Recent Labs    02/26/21 0522  BILITOT 0.6  AST 20  ALT 14  ALKPHOS 60  PROT 7.2  ALBUMIN 3.2*    Assessment and Plan:  40 y.o. female inpatient. No significant medical history. Presented to the ED at [facility] with dysuria since 4.14.22 and  2 days of left flank pain with generalized body aches. Pain was persistent and worsening after 2 different courses of antibiotics. Patient was found to have obstructive renal stones with hydronephrosis. CT renal from 4.30.22 reads Moderate left-sided hydronephrosis secondary to a large obstructing stone in the left renal pelvis. IR placed a left sided nephrostomy tube on 5.1.22. Per notes from Urology plan is for a PCNL in 2 to 3 weeks for management of the staghorn calculi.   Urine cultures from 4.30.22 show insignificant growth. WBC is 4.5, BUN Cr within normal parameters. Blood cultures negative X 2 days. Nephrostomy tube is draining clear yellow urine. Patient reporting a resolution of pain since left sided nephrostomy tube placement.   Patient to stable from IR perspective s/p left sided nephrostomy tube placement.  Further plans per  Urology and primary Team please call IR with questions or concerns.   Electronically Signed: 7.1.22, NP 02/27/2021, 9:34 AM   I spent a total of 15 Minutes at the patient's bedside AND on the patient's hospital floor or unit, greater than 50% of which was counseling/coordinating care for left sided nephrostomy tube placement

## 2021-02-27 NOTE — Progress Notes (Signed)
AVS reviewed with pt and instructions provided on how to empty percutaneous drain. Pt denies any concerns and NAD noted @ discharge.

## 2021-02-28 NOTE — H&P (View-Only) (Signed)
03/01/2021  2:26 PM   Julie Austin [birth year] [id number]  Referring provider: [facility], [facility] [address] [place],  [place] [address] Chief Complaint  Patient presents with  . Nephrolithiasis    HPI: Julie Austin is a 40 y.o. female with a personal history of UTIs, who returns today for a stone follow up.  She was recently admitted to the [facility] ER on 02/25/2021 for possible sepsis. She was diagnosed with a UTI without hematuria (site unspecified), pyelonephritis, and kidney stone. She presented with dysuria, left flank pain, and a fever at home that had started on 4/14. She had taken a course of antibiotics for 10 days x3 per day, and was then on Bactrim. She continued to be symptomatic. Her UA showed >50 WBC and had rare bacteria.   CT scan from ER visit showed moderate left-sided hydronephrosis secondary to large obstructing stone in the left renal pelvis. Also findings were suggestive of a possible UTI.  She had a left IR percutaneous nephrostomy tube  placement on 02/26/2021 which was successful. This percutaneous access could potentially be used for a percutaneous nephrolithotomy procedure but not clear if the drain is entering through a calyx versus distal aspect of an infundibulum.  Today she reports that she started feeling bad on the 11th of April. And she went other doctor on the 14th and they gave her antibiotics, which seemed to help her symptoms. She returned to work a week later, then a few days after this she started to feel bad again and started to have the same symptoms. She went back to the doctor and they told her that they gave her the wrong medication for the wrong bacteria.This didn't help, so she went to the ER a few days later for this. She was given Cipro.  She says that she has never had a kidney stone before.    PMH: Past Medical History:  Diagnosis Date  . Blurry vision    wears glasses  . History of anemia   . History of  gestational diabetes   . History of urinary tract infection   . Vaginal itching   . Weight gain     Surgical History: Past Surgical History:  Procedure Laterality Date  . IR NEPHROSTOMY PLACEMENT LEFT  02/26/2021  . LEEP     [facility] (thinks this was procedure as done under general anesthesia due to an abnormal PAP)    Home Medications:  Allergies as of 03/01/2021   No Known Allergies     Medication List       Accurate as of Mar 01, 2021  2:26 PM. If you have any questions, ask your nurse or doctor.        cholecalciferol 25 MCG (1000 UNIT) tablet Commonly known as: VITAMIN D3 Take 1,000 Units by mouth daily.   ciprofloxacin 500 MG tablet Commonly known as: CIPRO Take 1 tablet (500 mg total) by mouth 2 (two) times daily for 14 days.   ibuprofen 200 MG tablet Commonly known as: ADVIL Take 400 mg by mouth every 6 (six) hours as needed for moderate pain or mild pain.   multivitamin tablet Take 1 tablet by mouth daily.   ondansetron 4 MG tablet Commonly known as: Zofran Take 1 tablet (4 mg total) by mouth daily as needed for nausea or vomiting.   traMADol 50 MG tablet Commonly known as: Ultram Take 1 tablet (50 mg total) by mouth every 6 (six) hours as needed.   vitamin C  100 MG tablet Take 100 mg by mouth daily.       Allergies: No Known Allergies  Family History: Family History  Problem Relation Age of Onset  . Diabetes Paternal Grandfather   . Diabetes Paternal Grandmother   . Diabetes Maternal Grandmother   . Heart disease Maternal Grandmother   . Diabetes Maternal Grandfather   . Diabetes Father   . Migraines Mother   . Kidney disease Son     Social History:   reports that she has never smoked. She has never used smokeless tobacco. She reports current alcohol use of about 1.0 standard drink of alcohol per week. She reports that she does not use drugs.  ROS: Pertinent ROS in HPI.  Physical Exam: BP 109/77   Pulse 76   Ht 5' 2" (1.575 m)   Wt 140  lb (63.5 kg)   BMI 25.61 kg/m   Constitutional:  Alert and oriented, No acute distress. HEENT: [place] AT, moist mucus membranes.  Trachea midline, no masses. Cardiovascular: No clubbing, cyanosis, or edema. GU: Left nephrostomy tube draining clear yellow urine Respiratory: Normal respiratory effort, no increased work of breathing. Skin: No rashes, bruises or suspicious lesions. Neurologic: Grossly intact, no focal deficits, moving all 4 extremities. Psychiatric: Normal mood and affect.  Laboratory Data:  Lab Results  Component Value Date   CREATININE 0.74 02/27/2021   I have reviewed the labs.   Pertinent Imaging:   INDICATION: 40-year-old with obstructive left renal calculi. Left hydronephrosis with intermittent fevers.  EXAM: PLACEMENT OF LEFT PERCUTANEOUS NEPHROSTOMY TUBE  COMPARISON:  CT 02/26/2019  MEDICATIONS: Ancef 2 g; The antibiotic was administered in an appropriate time frame prior to skin puncture.  ANESTHESIA/SEDATION: Fentanyl 25 mcg IV; Versed 1.0 mg IV  Moderate Sedation Time:  20 minutes  The patient was continuously monitored during the procedure by the interventional radiology nurse under my direct supervision.  CONTRAST:  15 mL-administered into the collecting system(s)  FLUOROSCOPY TIME:  Fluoroscopy Time: 2 minutes, 54 seconds, 12.8 mGy  COMPLICATIONS: None immediate.  PROCEDURE: Informed written consent was obtained from the patient after a thorough discussion of the procedural risks, benefits and alternatives. All questions were addressed. Maximal Sterile Barrier Technique was utilized including caps, mask, sterile gowns, sterile gloves, sterile drape, hand hygiene and skin antiseptic. A timeout was performed prior to the initiation of the procedure.  Patient was placed prone. Left flank was prepped and draped in sterile fashion. Ultrasound demonstrated left hydronephrosis and echogenic left renal stones. Skin was  anesthetized using 1% lidocaine. Small incision was made. Using ultrasound guidance, 21 gauge needle was directed into the lower pole collecting system just distal to an infundibular stone. Contrast injection confirmed placement in the renal collecting system. A 0.018 wire was easily advanced into the renal pelvis. A transitional dilator set was placed. Bentson wire was advanced into the upper pole of the left kidney. Tract was dilated to accommodate a 10 French multipurpose drain. Due to the large renal pelvic stone, the pigtail catheter was placed in the upper pole collecting system. Contrast injection confirmed placement and catheter was attached to a gravity bag. Drain was secured to the skin with suture.  FINDINGS: Large stone in the left renal pelvis and another prominent stone involving a lower pole infundibulum. Needle was directed into collecting system just distal to the infundibular stone. Not clear if the needle was placed within the infundibulum or a small calyx. 10 French drain was placed with the pigtail in the upper   pole.  IMPRESSION: 1. Successful placement of a left percutaneous nephrostomy tube. 2. This percutaneous access could potentially be used for a percutaneous nephrolithotomy procedure but not clear if the drain is entering through a calyx versus distal aspect of an infundibulum.   Electronically Signed   By: Richarda Overlie M.D.   On: 02/26/2021 16:59  I have personally reviewed the images and agree with radiologist interpretation.    Urinalysis: UA colleted today  Assessment & Plan:    1. Left kidney stone Percutaneous nephrolithotomy was discussed to remove the stone-standard of care for this large stone  We discussed the procedure at length including the preoperative, perioperative and postoperative course.  We discussed the risk including risk of bleeding, risk of blood transfusion around 5%, damage to surrounding structures, need for  postoperative urinary drainage, urine leak which is expected, need for possible ureteral stent and staged procedure, infection amongst others.  All questions answered.  We will have her proceed to interventional radiology for exchange of nephrostomy tube and nephroureteral stent prior to PCNL.  Schedule for Monday (03/06/2021).  Repeat preop UCx   I, Wyn Quaker, am acting as a scribe for Dr. [name].   I have reviewed the above documentation for accuracy and completeness, and I agree with the above.   [name], MD   [facility] [address], [address] [place], [place] [address] [phone]

## 2021-02-28 NOTE — Progress Notes (Signed)
03/01/2021  2:26 PM   Julie Austin [birth year] [id number]  Referring provider: Center, [facility] [address] [place],  [place] [address] Chief Complaint  Patient presents with  . Nephrolithiasis    HPI: Julie Austin is a 40 y.o. female with a personal history of UTIs, who returns today for a stone follow up.  She was recently admitted to the [facility] ER on 02/25/2021 for possible sepsis. She was diagnosed with a UTI without hematuria (site unspecified), pyelonephritis, and kidney stone. She presented with dysuria, left flank pain, and a fever at home that had started on 4/14. She had taken a course of antibiotics for 10 days x3 per day, and was then on Bactrim. She continued to be symptomatic. Her UA showed >50 WBC and had rare bacteria.   CT scan from ER visit showed moderate left-sided hydronephrosis secondary to large obstructing stone in the left renal pelvis. Also findings were suggestive of a possible UTI.  She had a left IR percutaneous nephrostomy tube  placement on 02/26/2021 which was successful. This percutaneous access could potentially be used for a percutaneous nephrolithotomy procedure but not clear if the drain is entering through a calyx versus distal aspect of an infundibulum.  Today she reports that she started feeling bad on the 11th of April. And she went other doctor on the 14th and they gave her antibiotics, which seemed to help her symptoms. She returned to work a week later, then a few days after this she started to feel bad again and started to have the same symptoms. She went back to the doctor and they told her that they gave her the wrong medication for the wrong bacteria.This didn't help, so she went to the ER a few days later for this. She was given Cipro.  She says that she has never had a kidney stone before.    PMH: Past Medical History:  Diagnosis Date  . Blurry vision    wears glasses  . History of anemia   . History of  gestational diabetes   . History of urinary tract infection   . Vaginal itching   . Weight gain     Surgical History: Past Surgical History:  Procedure Laterality Date  . IR NEPHROSTOMY PLACEMENT LEFT  02/26/2021  . LEEP     [facility] (thinks this was procedure as done under general anesthesia due to an abnormal PAP)    Home Medications:  Allergies as of 03/01/2021   No Known Allergies     Medication List       Accurate as of Mar 01, 2021  2:26 PM. If you have any questions, ask your nurse or doctor.        cholecalciferol 25 MCG (1000 UNIT) tablet Commonly known as: VITAMIN D3 Take 1,000 Units by mouth daily.   ciprofloxacin 500 MG tablet Commonly known as: CIPRO Take 1 tablet (500 mg total) by mouth 2 (two) times daily for 14 days.   ibuprofen 200 MG tablet Commonly known as: ADVIL Take 400 mg by mouth every 6 (six) hours as needed for moderate pain or mild pain.   multivitamin tablet Take 1 tablet by mouth daily.   ondansetron 4 MG tablet Commonly known as: Zofran Take 1 tablet (4 mg total) by mouth daily as needed for nausea or vomiting.   traMADol 50 MG tablet Commonly known as: Ultram Take 1 tablet (50 mg total) by mouth every 6 (six) hours as needed.   vitamin C  100 MG tablet Take 100 mg by mouth daily.       Allergies: No Known Allergies  Family History: Family History  Problem Relation Age of Onset  . Diabetes Paternal Grandfather   . Diabetes Paternal Grandmother   . Diabetes Maternal Grandmother   . Heart disease Maternal Grandmother   . Diabetes Maternal Grandfather   . Diabetes Father   . Migraines Mother   . Kidney disease Son     Social History:   reports that she has never smoked. She has never used smokeless tobacco. She reports current alcohol use of about 1.0 standard drink of alcohol per week. She reports that she does not use drugs.  ROS: Pertinent ROS in HPI.  Physical Exam: BP 109/77   Pulse 76   Ht 5\' 2"  (1.575 m)   Wt 140  lb (63.5 kg)   BMI 25.61 kg/m   Constitutional:  Alert and oriented, No acute distress. HEENT: [place] AT, moist mucus membranes.  Trachea midline, no masses. Cardiovascular: No clubbing, cyanosis, or edema. GU: Left nephrostomy tube draining clear yellow urine Respiratory: Normal respiratory effort, no increased work of breathing. Skin: No rashes, bruises or suspicious lesions. Neurologic: Grossly intact, no focal deficits, moving all 4 extremities. Psychiatric: Normal mood and affect.  Laboratory Data:  Lab Results  Component Value Date   CREATININE 0.74 02/27/2021   I have reviewed the labs.   Pertinent Imaging:   INDICATION: 40 year old with obstructive left renal calculi. Left hydronephrosis with intermittent fevers.  EXAM: PLACEMENT OF LEFT PERCUTANEOUS NEPHROSTOMY TUBE  COMPARISON:  CT 02/26/2019  MEDICATIONS: Ancef 2 g; The antibiotic was administered in an appropriate time frame prior to skin puncture.  ANESTHESIA/SEDATION: Fentanyl 25 mcg IV; Versed 1.0 mg IV  Moderate Sedation Time:  20 minutes  The patient was continuously monitored during the procedure by the interventional radiology nurse under my direct supervision.  CONTRAST:  15 mL-administered into the collecting system(s)  FLUOROSCOPY TIME:  Fluoroscopy Time: 2 minutes, 54 seconds, 12.8 mGy  COMPLICATIONS: None immediate.  PROCEDURE: Informed written consent was obtained from the patient after a thorough discussion of the procedural risks, benefits and alternatives. All questions were addressed. Maximal Sterile Barrier Technique was utilized including caps, mask, sterile gowns, sterile gloves, sterile drape, hand hygiene and skin antiseptic. A timeout was performed prior to the initiation of the procedure.  Patient was placed prone. Left flank was prepped and draped in sterile fashion. Ultrasound demonstrated left hydronephrosis and echogenic left renal stones. Skin was  anesthetized using 1% lidocaine. Small incision was made. Using ultrasound guidance, 21 gauge needle was directed into the lower pole collecting system just distal to an infundibular stone. Contrast injection confirmed placement in the renal collecting system. A 0.018 wire was easily advanced into the renal pelvis. A transitional dilator set was placed. Bentson wire was advanced into the upper pole of the left kidney. Tract was dilated to accommodate a 10 02/28/2019 multipurpose drain. Due to the large renal pelvic stone, the pigtail catheter was placed in the upper pole collecting system. Contrast injection confirmed placement and catheter was attached to a gravity bag. Drain was secured to the skin with suture.  FINDINGS: Large stone in the left renal pelvis and another prominent stone involving a lower pole infundibulum. Needle was directed into collecting system just distal to the infundibular stone. Not clear if the needle was placed within the infundibulum or a small calyx. 10 French drain was placed with the pigtail in the upper  pole.  IMPRESSION: 1. Successful placement of a left percutaneous nephrostomy tube. 2. This percutaneous access could potentially be used for a percutaneous nephrolithotomy procedure but not clear if the drain is entering through a calyx versus distal aspect of an infundibulum.   Electronically Signed   By: Richarda Overlie M.D.   On: 02/26/2021 16:59  I have personally reviewed the images and agree with radiologist interpretation.    Urinalysis: UA colleted today  Assessment & Plan:    1. Left kidney stone Percutaneous nephrolithotomy was discussed to remove the stone-standard of care for this large stone  We discussed the procedure at length including the preoperative, perioperative and postoperative course.  We discussed the risk including risk of bleeding, risk of blood transfusion around 5%, damage to surrounding structures, need for  postoperative urinary drainage, urine leak which is expected, need for possible ureteral stent and staged procedure, infection amongst others.  All questions answered.  We will have her proceed to interventional radiology for exchange of nephrostomy tube and nephroureteral stent prior to PCNL.  Schedule for Monday (03/06/2021).  Repeat preop UCx   I, Wyn Quaker, am acting as a scribe for Dr. [name].   I have reviewed the above documentation for accuracy and completeness, and I agree with the above.   [name], MD   [facility] [address], [address] [place], [place] [address] [phone]

## 2021-03-01 ENCOUNTER — Ambulatory Visit (INDEPENDENT_AMBULATORY_CARE_PROVIDER_SITE_OTHER): Payer: Self-pay | Admitting: Urology

## 2021-03-01 ENCOUNTER — Other Ambulatory Visit: Payer: Self-pay

## 2021-03-01 ENCOUNTER — Other Ambulatory Visit: Payer: Self-pay | Admitting: Urology

## 2021-03-01 ENCOUNTER — Encounter: Payer: Self-pay | Admitting: Urology

## 2021-03-01 VITALS — BP 109/77 | HR 76 | Ht 62.0 in | Wt 140.0 lb

## 2021-03-01 DIAGNOSIS — N200 Calculus of kidney: Secondary | ICD-10-CM

## 2021-03-01 LAB — URINALYSIS, COMPLETE
Bilirubin, UA: NEGATIVE
Glucose, UA: NEGATIVE
Ketones, UA: NEGATIVE
Nitrite, UA: NEGATIVE
Specific Gravity, UA: 1.025 (ref 1.005–1.030)
Urobilinogen, Ur: 0.2 mg/dL (ref 0.2–1.0)
pH, UA: 7 (ref 5.0–7.5)

## 2021-03-01 LAB — MICROSCOPIC EXAMINATION
Bacteria, UA: NONE SEEN
RBC, Urine: >30 /hpf — AB (ref 0–2)

## 2021-03-02 ENCOUNTER — Encounter
Admission: RE | Admit: 2021-03-02 | Discharge: 2021-03-02 | Disposition: A | Discharge status: Home / Self Care | Payer: Self-pay | Source: Ambulatory Visit | Attending: Urology | Admitting: Urology

## 2021-03-02 ENCOUNTER — Other Ambulatory Visit
Admission: RE | Admit: 2021-03-02 | Discharge: 2021-03-02 | Disposition: A | Discharge status: Home / Self Care | Payer: MEDICAID | Source: Ambulatory Visit | Attending: Urology | Admitting: Urology

## 2021-03-02 DIAGNOSIS — Z20822 Contact with and (suspected) exposure to covid-19: Secondary | ICD-10-CM | POA: Insufficient documentation

## 2021-03-02 DIAGNOSIS — Z01812 Encounter for preprocedural laboratory examination: Secondary | ICD-10-CM | POA: Insufficient documentation

## 2021-03-02 LAB — CULTURE, BLOOD (ROUTINE X 2)
Culture: NO GROWTH
Culture: NO GROWTH

## 2021-03-02 LAB — TYPE AND SCREEN
ABO/RH(D): O POS
Antibody Screen: NEGATIVE

## 2021-03-02 NOTE — Progress Notes (Signed)
Patient on schedule for PCNL/Or 03/06/2021, called and spoke with patient on phone with instructions given. Told to be NPO after MN prior to procedure. I told her that OR would call her tomorrow to let her know when to arrive prior for Or workup, then IR procedure with [place] at 0800, stated understanding

## 2021-03-03 ENCOUNTER — Other Ambulatory Visit: Payer: Self-pay

## 2021-03-03 ENCOUNTER — Other Ambulatory Visit
Admission: RE | Admit: 2021-03-03 | Discharge: 2021-03-03 | Disposition: A | Discharge status: Home / Self Care | Payer: MEDICAID | Source: Ambulatory Visit | Attending: Urology | Admitting: Urology

## 2021-03-03 ENCOUNTER — Other Ambulatory Visit: Payer: Self-pay | Admitting: Student

## 2021-03-03 HISTORY — DX: Anemia, unspecified: D64.9

## 2021-03-03 LAB — SARS CORONAVIRUS 2 (TAT 6-24 HRS): SARS Coronavirus 2: NEGATIVE

## 2021-03-03 LAB — METHYLMALONIC ACID, SERUM: Methylmalonic Acid, Quantitative: 99 nmol/L (ref 0–378)

## 2021-03-03 NOTE — Patient Instructions (Addendum)
Your procedure is scheduled on: 03/06/21 - Monday Report to the Registration Desk on the 1st floor of the [facility].  REMEMBER: Instructions that are not followed completely may result in serious medical risk, up to and including death; or upon the discretion of your surgeon and anesthesiologist your surgery may need to be rescheduled.  Do not eat food after midnight the night before surgery.  No gum chewing, lozengers or hard candies.  You may however, drink CLEAR liquids up to 2 hours before you are scheduled to arrive for your surgery. Do not drink anything within 2 hours of your scheduled arrival time.  TAKE THESE MEDICATIONS THE MORNING OF SURGERY WITH A SIP OF WATER: - ciprofloxacin (CIPRO) 500 MG tablet  One week prior to surgery: Stop Anti-inflammatories (NSAIDS) such as Advil, Aleve, Ibuprofen, Motrin, Naproxen, Naprosyn and Aspirin based products such as Excedrin, Goodys Powder, BC Powder.  Stop ANY OVER THE COUNTER supplements until after surgery.  No Alcohol for 24 hours before or after surgery.  No Smoking including e-cigarettes for 24 hours prior to surgery.  No chewable tobacco products for at least 6 hours prior to surgery.  No nicotine patches on the day of surgery.  Do not use any "recreational" drugs for at least a week prior to your surgery.  Please be advised that the combination of cocaine and anesthesia may have negative outcomes, up to and including death. If you test positive for cocaine, your surgery will be cancelled.  On the morning of surgery brush your teeth with toothpaste and water, you may rinse your mouth with mouthwash if you wish. Do not swallow any toothpaste or mouthwash.  Do not wear jewelry, make-up, hairpins, clips or nail polish.  Do not wear lotions, powders, or perfumes.   Do not shave body from the neck down 48 hours prior to surgery just in case you cut yourself which could leave a site for infection.  Also, freshly shaved skin may  become irritated if using the CHG soap.  Contact lenses, hearing aids and dentures may not be worn into surgery.  Do not bring valuables to the hospital. [facility] is not responsible for any missing/lost belongings or valuables.   Notify your doctor if there is any change in your medical condition (cold, fever, infection).  Wear comfortable clothing (specific to your surgery type) to the hospital.  Plan for stool softeners for home use; pain medications have a tendency to cause constipation. You can also help prevent constipation by eating foods high in fiber such as fruits and vegetables and drinking plenty of fluids as your diet allows.  After surgery, you can help prevent lung complications by doing breathing exercises.  Take deep breaths and cough every 1-2 hours. Your doctor may order a device called an Incentive Spirometer to help you take deep breaths. When coughing or sneezing, hold a pillow firmly against your incision with both hands. This is called "splinting." Doing this helps protect your incision. It also decreases belly discomfort.  If you are being admitted to the hospital overnight, leave your suitcase in the car. After surgery it may be brought to your room.  If you are being discharged the day of surgery, you will not be allowed to drive home. You will need a responsible adult (18 years or older) to drive you home and stay with you that night.   If you are taking public transportation, you will need to have a responsible adult (18 years or older) with you.  Please confirm with your physician that it is acceptable to use public transportation.   Please call the Pre-admissions Testing Dept. at [phone] if you have any questions about these instructions.  Surgery Visitation Policy:  Patients undergoing a surgery or procedure may have one family member or support person with them as long as that person is not COVID-19 positive or experiencing its symptoms.  That  person may remain in the waiting area during the procedure.  Inpatient Visitation:    Visiting hours are 7 a.m. to 8 p.m. Inpatients will be allowed two visitors daily. The visitors may change each day during the patient's stay. No visitors under the age of 60. Any visitor under the age of 56 must be accompanied by an adult. The visitor must pass COVID-19 screenings, use hand sanitizer when entering and exiting the patient's room and wear a mask at all times, including in the patient's room. Patients must also wear a mask when staff or their visitor are in the room. Masking is required regardless of vaccination status.

## 2021-03-06 ENCOUNTER — Encounter
Admission: RE | Disposition: A | Discharge status: Home / Self Care | Payer: Self-pay | Source: Home / Self Care | Attending: Urology

## 2021-03-06 ENCOUNTER — Ambulatory Visit
Admission: RE | Admit: 2021-03-06 | Discharge: 2021-03-06 | Disposition: A | Discharge status: Home / Self Care | Payer: Self-pay | Source: Ambulatory Visit | Attending: Urology | Admitting: Urology

## 2021-03-06 ENCOUNTER — Encounter: Payer: Self-pay | Admitting: Urology

## 2021-03-06 ENCOUNTER — Ambulatory Visit: Payer: Self-pay

## 2021-03-06 ENCOUNTER — Ambulatory Visit: Payer: Self-pay | Admitting: Anesthesiology

## 2021-03-06 ENCOUNTER — Inpatient Hospital Stay
Admission: RE | Admit: 2021-03-06 | Discharge: 2021-03-07 | DRG: 661 | Disposition: A | Discharge status: Home / Self Care | Payer: Self-pay | Attending: Urology | Admitting: Urology

## 2021-03-06 DIAGNOSIS — N200 Calculus of kidney: Secondary | ICD-10-CM

## 2021-03-06 DIAGNOSIS — Z833 Family history of diabetes mellitus: Secondary | ICD-10-CM

## 2021-03-06 DIAGNOSIS — Z79899 Other long term (current) drug therapy: Secondary | ICD-10-CM

## 2021-03-06 DIAGNOSIS — Z8249 Family history of ischemic heart disease and other diseases of the circulatory system: Secondary | ICD-10-CM

## 2021-03-06 DIAGNOSIS — Z419 Encounter for procedure for purposes other than remedying health state, unspecified: Secondary | ICD-10-CM

## 2021-03-06 DIAGNOSIS — Z87440 Personal history of urinary (tract) infections: Secondary | ICD-10-CM

## 2021-03-06 HISTORY — PX: NEPHROLITHOTOMY: SHX5134

## 2021-03-06 HISTORY — PX: IR CONVERT LEFT NEPHROSTOMY TO NEPHROURETERAL CATH: IMG6067

## 2021-03-06 LAB — POCT PREGNANCY, URINE: Preg Test, Ur: NEGATIVE

## 2021-03-06 LAB — CBC
HCT: 32.6 % — ABNORMAL LOW (ref 36.0–46.0)
Hemoglobin: 10.3 g/dL — ABNORMAL LOW (ref 12.0–15.0)
MCH: 25.4 pg — ABNORMAL LOW (ref 26.0–34.0)
MCHC: 31.6 g/dL (ref 30.0–36.0)
MCV: 80.3 fL (ref 80.0–100.0)
Platelets: 282 10*3/uL (ref 150–400)
RBC: 4.06 MIL/uL (ref 3.87–5.11)
RDW: 16.5 % — ABNORMAL HIGH (ref 11.5–15.5)
WBC: 4.7 10*3/uL (ref 4.0–10.5)
nRBC: 0 % (ref 0.0–0.2)

## 2021-03-06 LAB — ABO/RH: ABO/RH(D): O POS

## 2021-03-06 LAB — PROTIME-INR
INR: 1 (ref 0.8–1.2)
Prothrombin Time: 13.5 seconds (ref 11.4–15.2)

## 2021-03-06 LAB — CULTURE, URINE COMPREHENSIVE

## 2021-03-06 SURGERY — NEPHROLITHOTOMY PERCUTANEOUS
Anesthesia: General | Site: Back | Laterality: Left

## 2021-03-06 MED ORDER — ONDANSETRON HCL 4 MG/2ML IJ SOLN
INTRAMUSCULAR | Status: AC
  Filled 2021-03-06: qty 2

## 2021-03-06 MED ORDER — MIDAZOLAM HCL 2 MG/2ML IJ SOLN
INTRAMUSCULAR | Status: AC
  Filled 2021-03-06: qty 2

## 2021-03-06 MED ORDER — CEFAZOLIN SODIUM-DEXTROSE 2-4 GM/100ML-% IV SOLN
INTRAVENOUS | Status: AC
  Filled 2021-03-06: qty 100

## 2021-03-06 MED ORDER — DEXAMETHASONE SODIUM PHOSPHATE 10 MG/ML IJ SOLN
INTRAMUSCULAR | Status: AC
  Filled 2021-03-06: qty 1

## 2021-03-06 MED ORDER — BELLADONNA ALKALOIDS-OPIUM 16.2-60 MG RE SUPP
1.0000 | Freq: Four times a day (QID) | RECTAL | Status: DC | PRN
Start: 2021-03-06 — End: 2021-03-07

## 2021-03-06 MED ORDER — ACETAMINOPHEN 10 MG/ML IV SOLN
INTRAVENOUS | Status: DC | PRN
  Administered 2021-03-06: 1000 mg via INTRAVENOUS

## 2021-03-06 MED ORDER — EPHEDRINE 5 MG/ML INJ
INTRAVENOUS | Status: AC
  Filled 2021-03-06: qty 10

## 2021-03-06 MED ORDER — DIPHENHYDRAMINE HCL 50 MG/ML IJ SOLN: 12.5000 mg | Freq: Four times a day (QID) | INTRAMUSCULAR | Status: DC | PRN

## 2021-03-06 MED ORDER — LIDOCAINE HCL (PF) 2 % IJ SOLN
INTRAMUSCULAR | Status: AC
  Filled 2021-03-06: qty 5

## 2021-03-06 MED ORDER — LIDOCAINE HCL (CARDIAC) PF 100 MG/5ML IV SOSY
PREFILLED_SYRINGE | INTRAVENOUS | Status: DC | PRN
  Administered 2021-03-06: 60 mg via INTRAVENOUS

## 2021-03-06 MED ORDER — KETOROLAC TROMETHAMINE 30 MG/ML IJ SOLN
INTRAMUSCULAR | Status: AC
  Filled 2021-03-06: qty 1

## 2021-03-06 MED ORDER — ROCURONIUM BROMIDE 10 MG/ML (PF) SYRINGE
PREFILLED_SYRINGE | INTRAVENOUS | Status: AC
  Filled 2021-03-06: qty 10

## 2021-03-06 MED ORDER — CHLORHEXIDINE GLUCONATE CLOTH 2 % EX PADS: 6.0000 | MEDICATED_PAD | Freq: Every day | CUTANEOUS | Status: DC

## 2021-03-06 MED ORDER — OXYBUTYNIN CHLORIDE 5 MG PO TABS: 5.0000 mg | ORAL_TABLET | Freq: Three times a day (TID) | ORAL | Status: DC | PRN

## 2021-03-06 MED ORDER — KETOROLAC TROMETHAMINE 30 MG/ML IJ SOLN
INTRAMUSCULAR | Status: DC | PRN
  Administered 2021-03-06: 30 mg via INTRAVENOUS

## 2021-03-06 MED ORDER — FENTANYL CITRATE (PF) 100 MCG/2ML IJ SOLN
INTRAMUSCULAR | Status: AC
  Filled 2021-03-06: qty 2

## 2021-03-06 MED ORDER — SUGAMMADEX SODIUM 200 MG/2ML IV SOLN
INTRAVENOUS | Status: DC | PRN
  Administered 2021-03-06: 150 mg via INTRAVENOUS

## 2021-03-06 MED ORDER — CHLORHEXIDINE GLUCONATE 0.12 % MT SOLN
OROMUCOSAL | Status: AC
  Administered 2021-03-06: 15 mL via OROMUCOSAL
  Filled 2021-03-06: qty 15

## 2021-03-06 MED ORDER — ACETAMINOPHEN 325 MG PO TABS: 650.0000 mg | ORAL_TABLET | ORAL | Status: DC | PRN

## 2021-03-06 MED ORDER — ONDANSETRON HCL 4 MG/2ML IJ SOLN
INTRAMUSCULAR | Status: DC | PRN
  Administered 2021-03-06: 4 mg via INTRAVENOUS

## 2021-03-06 MED ORDER — DIPHENHYDRAMINE HCL 12.5 MG/5ML PO ELIX
12.5000 mg | ORAL_SOLUTION | Freq: Four times a day (QID) | ORAL | Status: DC | PRN
  Filled 2021-03-06: qty 5

## 2021-03-06 MED ORDER — ROCURONIUM BROMIDE 100 MG/10ML IV SOLN
INTRAVENOUS | Status: DC | PRN
  Administered 2021-03-06: 10 mg via INTRAVENOUS
  Administered 2021-03-06: 40 mg via INTRAVENOUS
  Administered 2021-03-06: 10 mg via INTRAVENOUS

## 2021-03-06 MED ORDER — HEPARIN SODIUM (PORCINE) 5000 UNIT/ML IJ SOLN
5000.0000 [IU] | Freq: Three times a day (TID) | INTRAMUSCULAR | Status: DC
  Administered 2021-03-06 – 2021-03-07 (×2): 5000 [IU] via SUBCUTANEOUS
  Filled 2021-03-06 (×2): qty 1

## 2021-03-06 MED ORDER — LACTATED RINGERS IV SOLN: INTRAVENOUS | Status: DC

## 2021-03-06 MED ORDER — ACETAMINOPHEN 10 MG/ML IV SOLN
INTRAVENOUS | Status: AC
  Filled 2021-03-06: qty 100

## 2021-03-06 MED ORDER — MIDAZOLAM HCL 2 MG/2ML IJ SOLN
INTRAMUSCULAR | Status: AC | PRN
  Administered 2021-03-06: 1 mg via INTRAVENOUS

## 2021-03-06 MED ORDER — DOCUSATE SODIUM 100 MG PO CAPS
100.0000 mg | ORAL_CAPSULE | Freq: Two times a day (BID) | ORAL | Status: DC
  Administered 2021-03-07: 100 mg via ORAL
  Filled 2021-03-06 (×2): qty 1

## 2021-03-06 MED ORDER — MIDAZOLAM HCL 2 MG/2ML IJ SOLN
INTRAMUSCULAR | Status: DC | PRN
  Administered 2021-03-06: 2 mg via INTRAVENOUS

## 2021-03-06 MED ORDER — PROPOFOL 10 MG/ML IV BOLUS
INTRAVENOUS | Status: AC
  Filled 2021-03-06: qty 20

## 2021-03-06 MED ORDER — ORAL CARE MOUTH RINSE: 15.0000 mL | Freq: Once | OROMUCOSAL | Status: AC

## 2021-03-06 MED ORDER — SODIUM CHLORIDE 0.9 % IV SOLN: INTRAVENOUS | Status: DC

## 2021-03-06 MED ORDER — FENTANYL CITRATE (PF) 100 MCG/2ML IJ SOLN: 25.0000 ug | INTRAMUSCULAR | Status: DC | PRN

## 2021-03-06 MED ORDER — DEXAMETHASONE SODIUM PHOSPHATE 10 MG/ML IJ SOLN
INTRAMUSCULAR | Status: DC | PRN
  Administered 2021-03-06: 10 mg via INTRAVENOUS

## 2021-03-06 MED ORDER — ONDANSETRON HCL 4 MG/2ML IJ SOLN: 4.0000 mg | INTRAMUSCULAR | Status: DC | PRN

## 2021-03-06 MED ORDER — FENTANYL CITRATE (PF) 100 MCG/2ML IJ SOLN
INTRAMUSCULAR | Status: AC | PRN
  Administered 2021-03-06: 50 ug via INTRAVENOUS

## 2021-03-06 MED ORDER — ZOLPIDEM TARTRATE 5 MG PO TABS: 5.0000 mg | ORAL_TABLET | Freq: Every evening | ORAL | Status: DC | PRN

## 2021-03-06 MED ORDER — SODIUM CHLORIDE 0.9 % IR SOLN
Status: DC | PRN
  Administered 2021-03-06: 9000 mL

## 2021-03-06 MED ORDER — SODIUM CHLORIDE 0.9 % IV SOLN
2.0000 g | Freq: Once | INTRAVENOUS | Status: DC
  Filled 2021-03-06: qty 20

## 2021-03-06 MED ORDER — SODIUM CHLORIDE 0.9 % IV SOLN
INTRAVENOUS | Status: AC | PRN
  Administered 2021-03-06: 2 g via INTRAVENOUS

## 2021-03-06 MED ORDER — OXYCODONE-ACETAMINOPHEN 5-325 MG PO TABS
1.0000 | ORAL_TABLET | ORAL | Status: DC | PRN
  Administered 2021-03-07: 2 via ORAL
  Filled 2021-03-06: qty 2

## 2021-03-06 MED ORDER — ONDANSETRON HCL 4 MG/2ML IJ SOLN
4.0000 mg | Freq: Once | INTRAMUSCULAR | Status: DC | PRN
Start: 2021-03-06 — End: 2021-03-06

## 2021-03-06 MED ORDER — MORPHINE SULFATE (PF) 2 MG/ML IV SOLN: 2.0000 mg | INTRAVENOUS | Status: DC | PRN

## 2021-03-06 MED ORDER — PROPOFOL 10 MG/ML IV BOLUS
INTRAVENOUS | Status: DC | PRN
  Administered 2021-03-06: 130 mg via INTRAVENOUS

## 2021-03-06 MED ORDER — CIPROFLOXACIN HCL 500 MG PO TABS
500.0000 mg | ORAL_TABLET | Freq: Two times a day (BID) | ORAL | Status: DC
  Administered 2021-03-06 – 2021-03-07 (×2): 500 mg via ORAL
  Filled 2021-03-06 (×2): qty 1

## 2021-03-06 MED ORDER — CHLORHEXIDINE GLUCONATE 0.12 % MT SOLN: 15.0000 mL | Freq: Once | OROMUCOSAL | Status: AC

## 2021-03-06 MED ORDER — STERILE WATER FOR IRRIGATION IR SOLN
Status: DC | PRN
  Administered 2021-03-06: 1000 mL

## 2021-03-06 MED ORDER — CEFAZOLIN SODIUM-DEXTROSE 2-4 GM/100ML-% IV SOLN
2.0000 g | INTRAVENOUS | Status: AC
  Administered 2021-03-06: 2 g via INTRAVENOUS

## 2021-03-06 MED ORDER — IODIXANOL 320 MG/ML IV SOLN
50.0000 mL | Freq: Once | INTRAVENOUS | Status: AC | PRN
  Administered 2021-03-06: 15 mL

## 2021-03-06 MED ORDER — FENTANYL CITRATE (PF) 100 MCG/2ML IJ SOLN
INTRAMUSCULAR | Status: DC | PRN
  Administered 2021-03-06 (×2): 50 ug via INTRAVENOUS

## 2021-03-06 MED ORDER — IOHEXOL 180 MG/ML  SOLN
INTRAMUSCULAR | Status: DC | PRN
  Administered 2021-03-06: 80 mL

## 2021-03-06 SURGICAL SUPPLY — 63 items
ADAPTER IRRIG TUBE 2 SPIKE SOL (ADAPTER) ×4 IMPLANT
ADAPTER SCOPE UROLOK II (MISCELLANEOUS) ×2 IMPLANT
BAG URINE DRAIN 2000ML AR STRL (UROLOGICAL SUPPLIES) ×2 IMPLANT
BALLN NEPHROSTOMY 10X15 (UROLOGICAL SUPPLIES) ×2 IMPLANT
BASKET ZERO TIP 1.9FR (BASKET) ×2 IMPLANT
BLADE SURG 15 STRL LF DISP TIS (BLADE) ×1 IMPLANT
BLADE SURG 15 STRL SS (BLADE) ×1
CATH COUNCIL 22FR (CATHETERS) IMPLANT
CATH FOLEY 2W COUNCIL 20FR 5CC (CATHETERS) IMPLANT
CATH FOLEY 2W COUNCIL 5CC 18FR (CATHETERS) ×2 IMPLANT
CATH STENT KAYE NEPHR TAMP (CATHETERS) IMPLANT
CATH URET FLEX-TIP 2 LUMEN 10F (CATHETERS) IMPLANT
CATH URETL 5X70 OPEN END (CATHETERS) ×4 IMPLANT
CATH/STENT KAYE NEPHR TAMP (CATHETERS)
CHLORAPREP W/TINT 26 (MISCELLANEOUS) ×2 IMPLANT
CNTNR SPEC 2.5X3XGRAD LEK (MISCELLANEOUS) ×1
CONT SPEC 4OZ STER OR WHT (MISCELLANEOUS) ×1
CONTAINER SPEC 2.5X3XGRAD LEK (MISCELLANEOUS) ×1 IMPLANT
COVER WAND RF STERILE (DRAPES) ×2 IMPLANT
DRAPE 3/4 80X56 (DRAPES) ×2 IMPLANT
DRAPE C ARM PK CFD 31 SPINE (DRAPES) IMPLANT
DRAPE C-ARM XRAY 36X54 (DRAPES) ×2 IMPLANT
DRAPE SURG 17X11 SM STRL (DRAPES) ×8 IMPLANT
GAUZE SPONGE 4X4 12PLY STRL (GAUZE/BANDAGES/DRESSINGS) ×2 IMPLANT
GLIDEWIRE STIFF .35X180X3 HYDR (WIRE) IMPLANT
GLOVE SURG ENC MOIS LTX SZ6.5 (GLOVE) ×4 IMPLANT
GLOVE SURG UNDER POLY LF SZ6.5 (GLOVE) ×4 IMPLANT
GOWN STRL REUS W/ TWL LRG LVL3 (GOWN DISPOSABLE) ×2 IMPLANT
GOWN STRL REUS W/TWL LRG LVL3 (GOWN DISPOSABLE) ×2
GUIDEWIRE GREEN .038 145CM (MISCELLANEOUS) ×2 IMPLANT
GUIDEWIRE INTRO SET STRAIGHT (WIRE) ×2 IMPLANT
GUIDEWIRE STR DUAL SENSOR (WIRE) ×2 IMPLANT
GUIDEWIRE STR ZIPWIRE 035X150 (MISCELLANEOUS) IMPLANT
GUIDEWIRE SUPER STIFF (WIRE) ×2 IMPLANT
HOLDER FOLEY CATH W/STRAP (MISCELLANEOUS) ×2 IMPLANT
IV NS IRRIG 3000ML ARTHROMATIC (IV SOLUTION) ×8 IMPLANT
KIT PROBE TRILOGY 3.9X350 (MISCELLANEOUS) ×2 IMPLANT
MANIFOLD NEPTUNE II (INSTRUMENTS) ×2 IMPLANT
MAT ABSORB  FLUID 56X50 GRAY (MISCELLANEOUS) ×2
MAT ABSORB FLUID 56X50 GRAY (MISCELLANEOUS) ×2 IMPLANT
NDL FASCIA INCISION 18GA (NEEDLE) ×2 IMPLANT
PACK BASIN MINOR ARMC (MISCELLANEOUS) ×2 IMPLANT
PAD ABD DERMACEA PRESS 5X9 (GAUZE/BANDAGES/DRESSINGS) ×4 IMPLANT
PAD PREP 24X41 OB/GYN DISP (PERSONAL CARE ITEMS) ×2 IMPLANT
SET IRRIGATING DISP (SET/KITS/TRAYS/PACK) ×2 IMPLANT
SHEET NEURO XL SOL CTL (MISCELLANEOUS) ×2 IMPLANT
SPONGE DRAIN TRACH 4X4 STRL 2S (GAUZE/BANDAGES/DRESSINGS) ×2 IMPLANT
STENT URET 6FRX24 CONTOUR (STENTS) IMPLANT
STENT URET 6FRX26 CONTOUR (STENTS) IMPLANT
STRAP SAFETY 5IN WIDE (MISCELLANEOUS) ×4 IMPLANT
SURGILUBE 2OZ TUBE FLIPTOP (MISCELLANEOUS) ×2 IMPLANT
SUT SILK 0 SH 30 (SUTURE) ×2 IMPLANT
SYR 10ML LL (SYRINGE) ×2 IMPLANT
SYR 20ML LL LF (SYRINGE) ×2 IMPLANT
SYR 30ML LL (SYRINGE) ×2 IMPLANT
SYR TOOMEY IRRIG 70ML (MISCELLANEOUS) ×2
SYRINGE TOOMEY IRRIG 70ML (MISCELLANEOUS) ×1 IMPLANT
TAPE CLOTH 3X10 WHT NS LF (GAUZE/BANDAGES/DRESSINGS) ×2 IMPLANT
TAPE MICROFOAM 4IN (TAPE) ×2 IMPLANT
TRACTIP FLEXIVA PULSE ID 200 (Laser) ×2 IMPLANT
TRAY FOLEY MTR SLVR 16FR STAT (SET/KITS/TRAYS/PACK) ×2 IMPLANT
TUBING CONNECTING 10 (TUBING) ×2 IMPLANT
WATER STERILE IRR 1000ML POUR (IV SOLUTION) ×2 IMPLANT

## 2021-03-06 NOTE — Progress Notes (Signed)
Chief Complaint: Patient was seen in consultation today for left renal calculi  Referring Physician(s): [name]  Supervising Physician: Ruel Favors  Patient Status: [facility] - Out-pt  History of Present Illness: Julie Austin is a 40 y.o. female with past medical history of UTI who presented to [facility] ED with fevers found to have left UPJ stone.  She underwent left nephrostomy tube placement by Dr. Lowella Dandy 02/26/21.  She returns to [facility] today for stone removal with Dr. Apolinar Junes.  Conversion of her left PCN for PCNL is requested.    Julie Austin is assessed in short stay.  She denies issues or concerns related to her current tube.  It is a 10Fr left nephrostomy with clear, yellow urine in the bag.  She denies bag pain. She is aware of plans for double procedure/OR today and is agreeable to proceed.  She has been NPO.  She does not take blood thinners.   Past Medical History:  Diagnosis Date  . Anemia   . Blurry vision    wears glasses  . History of anemia   . History of gestational diabetes   . History of urinary tract infection   . Vaginal itching   . Weight gain     Past Surgical History:  Procedure Laterality Date  . IR NEPHROSTOMY PLACEMENT LEFT  02/26/2021  . LEEP     [facility] (thinks this was procedure as done under general anesthesia due to an abnormal PAP)    Allergies: Patient has no known allergies.  Medications: Prior to Admission medications   Medication Sig Start Date End Date Taking? Authorizing Provider  Ascorbic Acid (VITAMIN C) 100 MG tablet Take 100 mg by mouth daily.    [provider]  cholecalciferol (VITAMIN D3) 25 MCG (1000 UNIT) tablet Take 1,000 Units by mouth daily.    [provider]  ciprofloxacin (CIPRO) 500 MG tablet Take 1 tablet (500 mg total) by mouth 2 (two) times daily for 14 days. 02/27/21 03/13/21  Rodolph Bong, MD  ibuprofen (ADVIL) 200 MG tablet Take 400 mg by mouth every 6 (six) hours as needed for moderate  pain or mild pain.    [provider]  Multiple Vitamin (MULTIVITAMIN) tablet Take 1 tablet by mouth daily.    [provider]  ondansetron (ZOFRAN) 4 MG tablet Take 1 tablet (4 mg total) by mouth daily as needed for nausea or vomiting. 02/27/21 02/27/22  Rodolph Bong, MD  traMADol (ULTRAM) 50 MG tablet Take 1 tablet (50 mg total) by mouth every 6 (six) hours as needed. 02/27/21 02/27/22  Rodolph Bong, MD     Family History  Problem Relation Age of Onset  . Diabetes Paternal Grandfather   . Diabetes Paternal Grandmother   . Diabetes Maternal Grandmother   . Heart disease Maternal Grandmother   . Diabetes Maternal Grandfather   . Diabetes Father   . Migraines Mother   . Kidney disease Son     Social History   Socioeconomic History  . Marital status: Single    Spouse name: Not on file  . Number of children: 7  . Years of education: 52  . Highest education level: Not on file  Occupational History  . Not on file  Tobacco Use  . Smoking status: Never Smoker  . Smokeless tobacco: Never Used  Vaping Use  . Vaping Use: Never used  Substance and Sexual Activity  . Alcohol use: Yes    Alcohol/week: 1.0 standard drink  Types: 1 Glasses of wine per week    Comment: Last ETOH use 12/23/2020 (glass of wine)  . Drug use: Never  . Sexual activity: Yes    Partners: Male    Birth control/protection: I.U.D.    Comment: Paragard inserted 02/14/2015 at [facility]  Other Topics Concern  . Not on file  Social History Narrative   ** Merged History Encounter **       Social Determinants of Health   Financial Resource Strain: Not on file  Food Insecurity: Not on file  Transportation Needs: Not on file  Physical Activity: Not on file  Stress: Not on file  Social Connections: Not on file     Review of Systems: A 12 point ROS discussed and pertinent positives are indicated in the HPI above.  All other systems are negative.  Review of Systems  Constitutional: Negative  for fatigue and fever.  Respiratory: Negative for shortness of breath and stridor.   Cardiovascular: Negative for chest pain.  Gastrointestinal: Negative for abdominal pain, diarrhea, nausea and vomiting.  Genitourinary: Negative for dysuria and hematuria.  Musculoskeletal: Negative for back pain.  Psychiatric/Behavioral: Negative for behavioral problems and confusion.    Vital Signs: LMP 02/10/2021 (Approximate)   Physical Exam Vitals and nursing note reviewed.  Constitutional:      General: She is not in acute distress.    Appearance: Normal appearance. She is not ill-appearing.  HENT:     Mouth/Throat:     Mouth: Mucous membranes are moist.     Pharynx: Oropharynx is clear.  Cardiovascular:     Rate and Rhythm: Normal rate and regular rhythm.  Pulmonary:     Effort: Pulmonary effort is normal.     Breath sounds: Normal breath sounds.  Abdominal:     General: Abdomen is flat.     Palpations: Abdomen is soft.  Musculoskeletal:        General: Normal range of motion.     Comments: L PCN in place  Skin:    General: Skin is warm and dry.  Neurological:     General: No focal deficit present.     Mental Status: She is alert and oriented to person, place, and time. Mental status is at baseline.  Psychiatric:        Mood and Affect: Mood normal.        Behavior: Behavior normal.        Thought Content: Thought content normal.        Judgment: Judgment normal.      MD Evaluation Airway: WNL Heart: WNL Abdomen: WNL Chest/ Lungs: WNL ASA  Classification: 2 Mallampati/Airway Score: Two   Imaging: CT Renal Stone Study  Result Date: 02/25/2021 CLINICAL DATA:  Flank pain. EXAM: CT ABDOMEN AND PELVIS WITHOUT CONTRAST TECHNIQUE: Multidetector CT imaging of the abdomen and pelvis was performed following the standard protocol without IV contrast. COMPARISON:  None. FINDINGS: Lower chest: The lung bases are clear. The heart size is normal. Hepatobiliary: The liver is normal.  Normal gallbladder.There is no biliary ductal dilation. Pancreas: Normal contours without ductal dilatation. No peripancreatic fluid collection. Spleen: Unremarkable. Adrenals/Urinary Tract: --Adrenal glands: Unremarkable. --Right kidney/ureter: The right kidney is small and somewhat atrophic when compared to the left. There are multiple nonobstructing stones throughout the right kidney consistent with nephrocalcinosis. --Left kidney/ureter: The left kidney is enlarged with moderate hydronephrosis secondary to a large obstructing stone in the renal pelvis measuring approximately 2.9 x 1.7 cm. The proximal mid left ureter is dilated  and demonstrates diffuse circumferential wall thickening. --Urinary bladder: Unremarkable. Stomach/Bowel: --Stomach/Duodenum: No hiatal hernia or other gastric abnormality. Normal duodenal course and caliber. --Small bowel: Unremarkable. --Colon: Unremarkable. --Appendix: Normal. Vascular/Lymphatic: Normal course and caliber of the major abdominal vessels. --No retroperitoneal lymphadenopathy. --No mesenteric lymphadenopathy. --No pelvic or inguinal lymphadenopathy. Reproductive: Small uterine fibroids are noted. Other: No ascites or free air. There is a small fat containing umbilical hernia. Musculoskeletal. No acute displaced fractures. IMPRESSION: 1. Moderate left-sided hydronephrosis secondary to a large obstructing stone in the left renal pelvis. 2. Dilated proximal and mid left ureter with associated diffuse circumferential urothelial wall thickening. Findings may be secondary to obstructing non radiopaque material such as blood clots or debris. Alternatively, findings may be secondary to an ascending urinary tract infection with underlying pyonephrosis. Urologic consultation is recommended. 3. Right-sided nephrocalcinosis without evidence for an obstructing process. Electronically Signed   By: Katherine Mantle M.D.   On: 02/25/2021 16:45   IR NEPHROSTOMY PLACEMENT  LEFT  Result Date: 02/26/2021 INDICATION: 40 year old with obstructive left renal calculi. Left hydronephrosis with intermittent fevers. EXAM: PLACEMENT OF LEFT PERCUTANEOUS NEPHROSTOMY TUBE COMPARISON:  CT 02/26/2019 MEDICATIONS: Ancef 2 g; The antibiotic was administered in an appropriate time frame prior to skin puncture. ANESTHESIA/SEDATION: Fentanyl 25 mcg IV; Versed 1.0 mg IV Moderate Sedation Time:  20 minutes The patient was continuously monitored during the procedure by the interventional radiology nurse under my direct supervision. CONTRAST:  15 mL-administered into the collecting system(s) FLUOROSCOPY TIME:  Fluoroscopy Time: 2 minutes, 54 seconds, 12.8 mGy COMPLICATIONS: None immediate. PROCEDURE: Informed written consent was obtained from the patient after a thorough discussion of the procedural risks, benefits and alternatives. All questions were addressed. Maximal Sterile Barrier Technique was utilized including caps, mask, sterile gowns, sterile gloves, sterile drape, hand hygiene and skin antiseptic. A timeout was performed prior to the initiation of the procedure. Patient was placed prone. Left flank was prepped and draped in sterile fashion. Ultrasound demonstrated left hydronephrosis and echogenic left renal stones. Skin was anesthetized using 1% lidocaine. Small incision was made. Using ultrasound guidance, 21 gauge needle was directed into the lower pole collecting system just distal to an infundibular stone. Contrast injection confirmed placement in the renal collecting system. A 0.018 wire was easily advanced into the renal pelvis. A transitional dilator set was placed. Bentson wire was advanced into the upper pole of the left kidney. Tract was dilated to accommodate a 10 [place] multipurpose drain. Due to the large renal pelvic stone, the pigtail catheter was placed in the upper pole collecting system. Contrast injection confirmed placement and catheter was attached to a gravity bag. Drain  was secured to the skin with suture. FINDINGS: Large stone in the left renal pelvis and another prominent stone involving a lower pole infundibulum. Needle was directed into collecting system just distal to the infundibular stone. Not clear if the needle was placed within the infundibulum or a small calyx. 10 French drain was placed with the pigtail in the upper pole. IMPRESSION: 1. Successful placement of a left percutaneous nephrostomy tube. 2. This percutaneous access could potentially be used for a percutaneous nephrolithotomy procedure but not clear if the drain is entering through a calyx versus distal aspect of an infundibulum. Electronically Signed   By: Richarda Overlie M.D.   On: 02/26/2021 16:59    Labs:  CBC: Recent Labs    02/25/21 1434 02/26/21 0522 02/27/21 0427 03/06/21 0719  WBC 7.4 4.6 4.5 4.7  HGB 9.9* 9.4* 8.5* 10.3*  HCT 32.4* 29.5* 27.2* 32.6*  PLT 362 317 307 282    COAGS: Recent Labs    02/25/21 2204 03/06/21 0719  INR 1.1 1.0    BMP: Recent Labs    02/25/21 1434 02/26/21 0522 02/27/21 0427  NA 132* 138 136  K 4.0 3.8 4.1  CL 101 107 105  CO2 22 23 25   GLUCOSE 91 95 92  BUN 10 11 10   CALCIUM 8.8* 8.7* 8.8*  CREATININE 0.90 0.77 0.74  GFRNONAA >60 >60 >60    LIVER FUNCTION TESTS: Recent Labs    02/26/21 0522  BILITOT 0.6  AST 20  ALT 14  ALKPHOS 60  PROT 7.2  ALBUMIN 3.2*    TUMOR MARKERS: No results for input(s): AFPTM, CEA, CA199, CHROMGRNA in the last 8760 hours.  Assessment and Plan: Patient with past medical history of left renal calculi presents for planned stone removal with Urology today.  IR consulted for PCNL conversion at the request of Dr. Apolinar JunesBrandon. She does have a left PCN in place which is functioning well without issue. Case reviewed by Dr. Miles CostainShick who approves patient for procedure.  Patient presents today in their usual state of health.  She has been NPO and is not currently on blood thinners.    Risks and benefits of  left PCNL conversion was discussed with the patient including, but not limited to, infection, bleeding, significant bleeding causing loss or decrease in renal function or damage to adjacent structures.   All of the patient's questions were answered, patient is agreeable to proceed.  Consent signed and in chart.  Thank you for this interesting consult.  I greatly enjoyed meeting Julie Austin and look forward to participating in their care.  A copy of this report was sent to the requesting provider on this date.  Electronically Signed: Hoyt KochKacie Sue-Ellen Witten Certain, PA 03/06/2021, 8:22 AM   I spent a total of  20 minutes   in face to face in clinical consultation, greater than 50% of which was counseling/coordinating care for left renal calculi

## 2021-03-06 NOTE — Progress Notes (Signed)
Patient clinically stable post left PCN exchange per Dr Miles Costain, tolerated well. denies complaints post procedure. Awake/alert and oriented post procedure. Report given to [facility] in specials post procedure. Received Versed 1 mg along with Fentanyl 50 mcg IV for procedure.

## 2021-03-06 NOTE — Anesthesia Preprocedure Evaluation (Signed)
Anesthesia Evaluation  Patient identified by MRN, date of birth, ID band Patient awake    Reviewed: Allergy & Precautions, NPO status , Patient's Chart, lab work & pertinent test results  Airway Mallampati: II  TM Distance: >3 FB     Dental  (+) Teeth Intact   Pulmonary    Pulmonary exam normal        Cardiovascular negative cardio ROS Normal cardiovascular exam     Neuro/Psych PSYCHIATRIC DISORDERS Depression negative neurological ROS     GI/Hepatic negative GI ROS, Neg liver ROS,   Endo/Other  diabetes, Gestational  Renal/GU Renal disease  Female GU complaint     Musculoskeletal negative musculoskeletal ROS (+)   Abdominal Normal abdominal exam  (+)   Peds negative pediatric ROS (+)  Hematology  (+) anemia ,   Anesthesia Other Findings Past Medical History: No date: Anemia No date: Blurry vision     Comment:  wears glasses No date: History of anemia No date: History of gestational diabetes No date: History of urinary tract infection No date: Vaginal itching No date: Weight gain  Reproductive/Obstetrics                             Anesthesia Physical Anesthesia Plan  ASA: II  Anesthesia Plan: General   Post-op Pain Management:    Induction: Intravenous  PONV Risk Score and Plan:   Airway Management Planned: Oral ETT  Additional Equipment:   Intra-op Plan:   Post-operative Plan: Extubation in OR  Informed Consent: I have reviewed the patients History and Physical, chart, labs and discussed the procedure including the risks, benefits and alternatives for the proposed anesthesia with the patient or authorized representative who has indicated his/her understanding and acceptance.     Dental advisory given  Plan Discussed with: CRNA and Surgeon  Anesthesia Plan Comments:         Anesthesia Quick Evaluation

## 2021-03-06 NOTE — Anesthesia Postprocedure Evaluation (Signed)
Anesthesia Post Note  Patient: Julie Austin  Procedure(s) Performed: NEPHROLITHOTOMY PERCUTANEOUS (Left Back)  Patient location during evaluation: PACU Anesthesia Type: General Level of consciousness: awake and alert and oriented Pain management: pain level controlled Vital Signs Assessment: post-procedure vital signs reviewed and stable Respiratory status: spontaneous breathing Cardiovascular status: blood pressure returned to baseline Anesthetic complications: no   No complications documented.   Last Vitals:  Vitals:   03/06/21 1315 03/06/21 1344  BP: 96/66 95/61  Pulse: (!) 59 64  Resp: 19 16  Temp:  (!) 36.2 C  SpO2: 99% 100%    Last Pain:  Vitals:   03/06/21 1344  TempSrc: Oral  PainSc: 0-No pain                 Makiah Foye

## 2021-03-06 NOTE — Anesthesia Procedure Notes (Signed)
Procedure Name: Intubation Date/Time: 03/06/2021 9:45 AM Performed by: Debe Coder, CRNA Pre-anesthesia Checklist: Patient identified, Emergency Drugs available, Suction available and Patient being monitored Patient Re-evaluated:Patient Re-evaluated prior to induction Oxygen Delivery Method: Circle system utilized Preoxygenation: Pre-oxygenation with 100% oxygen Induction Type: IV induction Ventilation: Mask ventilation without difficulty Laryngoscope Size: Mac and 4 Grade View: Grade I Tube type: Oral Tube size: 6.5 mm Number of attempts: 1 Airway Equipment and Method: Stylet Placement Confirmation: ETT inserted through vocal cords under direct vision,  positive ETCO2 and breath sounds checked- equal and bilateral Secured at: 20 cm Tube secured with: Tape Dental Injury: Teeth and Oropharynx as per pre-operative assessment

## 2021-03-06 NOTE — Op Note (Signed)
Date of procedure: 03/06/21  Preoperative diagnosis:  1. Left 2.9 cm left partial staghorn calculus  Postoperative diagnosis:  1. Same as above  Procedure: 1. Left percutaneous nephrolithotomy 2. Left antegrade nephrostogram 3. Left antegrade ureteroscopy 4. Left laser lithotripsy and basket extraction of stone fragment 5. Left nephrostomy tube exchange 6. Interpretation of fluoroscopy less than 30 minutes  Surgeon: [name], MD  Anesthesia: General  Complications: None  Intraoperative findings: 2.9 cm left lower pole stone extending into the renal pelvis with a small midpole stone, completely obliterated with all fragments removed.  No extravasation other than through nephrostomy tract on final antegrade nephrostogram with contrast coursing along the ureter, unobstructed  EBL: 200 cc  Specimens: Stone fragment  Drains: 16 French Foley catheter per urethra, 18 French council tip Foley catheter per nephrostomy tube tract, 5 French open-ended nephroureteral catheter  Indication: Sergio Hobart is a 40 y.o. patient with obstructing partial staghorn calculus initially presented with infection who underwent nephrostomy tube placement.  She returns today for definitive management of her stone after infection is adequately treated.  After reviewing the management options for treatment, she elected to proceed with the above surgical procedure(s). We have discussed the potential benefits and risks of the procedure, side effects of the proposed treatment, the likelihood of the patient achieving the goals of the procedure, and any potential problems that might occur during the procedure or recuperation. Informed consent has been obtained.  Description of procedure:  The patient was taken to the operating room and general anesthesia was induced.  While supine on the stretcher, a 16 French Foley catheter was placed.  She was then carefully repositioned in the prone position with care  to pad all pressure points.  She was strapped to the bed.  She was then prepped and draped in a standard sterile fashion.  A timeout protocol was performed confirming the patient's identity and procedure.  Perioperative antibiotics in the form of Ancef were administered.  Scout imaging performed revealed some retained contrast material into the collecting system from her previous procedure.  A nephroureteral catheter was seen traversing the UPJ and down the ureter.  The stone was also visualized in the lower pole, renal pelvis and a stone in the midpole calyx.  First, a Super Stiff wire was placed down to the level of the kidney through the fro ureteral catheter.  The nephroureteral ureteral catheter was removed.  A fascial incision needle was used to make a cruciate incision in the fascia over the wire.  Next, an 8/10 [place] access sheath was used to the level of the UPJ in order to advance a sensor wire as a safety wire down to the level of the bladder.  This was snapped in place for the entirety of the case.  The Super Stiff wire was used as the working wire.  A 15 blade was used to make an approximately 2 cm incision in the flank at the site of the wires.  I NephroMax balloon was then brought in and the radiopaque marker was advanced to the level of the stone which was also palpated when it reached this fluoroscopic location.  The balloon was then inflated elevated dwell for several minutes for hemostasis.  The access sheath was then advanced over this balloon to the level of the stone.  The balloon was deflated and the nephroscope was brought in directly right on top of the stone.  The trilogy lithotripter was brought him on the stone was fragmented carefully  to avoid any injury to the urothelial mucosa.  I was able to break up all of the lower pole stone as well as the stone in the renal pelvis.  Once this area was cleared, on scout imaging, there is still another midpole calyx full of stone.  Due to the  angles, is not able to navigate with the nephroscope to this location.  Exchange the nephroscope for a 16 French flexible cystoscope and I was able to navigate first into the upper pole using fluoroscopy and then the midpole calyx.  I brought in a 242 m laser fiber and using dusting settings of 0.3 J and 60 Hz, I fragmented this approximately 8 mm sized stone.  It did not appear to be contiguous with the larger partial staghorn stone.  Once almost completely dusted, used a 1.9 [place] tipless nitinol basket to remove all fragments.  An additional fragment was identified in the lower pole which is also removed.  On scout imaging, no additional stones were identified.  The cystoscope was able to be advanced down the ureter all the way to the at least mid possibly lower ureter and no additional fragments were identified.  Finally, I reposition the scope back in the renal pelvis and performed antegrade nephrostogram.  This outline the collecting system which showed no contrast extravasation and no further filling defects suggesting further stone material.  Lastly, directly for scope, I advanced a 5 [place] open-ended ureteral catheter halfway down the ureter.  I left the nephroureteral catheter in place to remove the scope.  I then advanced a 18 [place] council tip catheter over the safety wire into the lower pole calyx with the tip in the renal pelvis.  The balloon was inflated with 2 cc of very diluted contrast showing the nephrostomy tube in excellent position.  The wire was then withdrawn.  I then removed the sheath.  A final antegrade nephrostogram again showed no filling defects, nephrostomy tube in good position along with nephroureteral catheter and most importantly, contrast traversing the UPJ down the ureter consistent with ureteral patency.  Finally, the nephroureteral catheter and nephrostomy tube were secured to the patient's left flank using silk x2.  The drapes were then removed and the patient was cleaned  and dried and a dressing of 4 x 4's, drain sponge, ABDs and foam tape was applied.  The nephrostomy tube was attached to a Foley bag.  She was repositioned on the stretcher and extubated.  She was taken to the PACU in stable condition.  Plan: She will be admitted overnight for observation.  We will cap her nephrostomy tube in the morning and remove her Foley for urine and labs remain clear and stable.  If she tolerates clamping trial, will remove all tubes and is to be discharged tomorrow.  I will have her follow-up with me in about a month with a KUB.  [name], M.D.

## 2021-03-06 NOTE — Procedures (Signed)
Interventional Radiology Procedure Note  Procedure: left pcn exchg for nephroureteral access    Complications: None  Estimated Blood Loss:  none  Findings: 5 fr cath passed the obstructing UPJ stone and adv to the bladder    M. Ruel Favors, MD

## 2021-03-06 NOTE — Interval H&P Note (Signed)
History and Physical Interval Note:  03/06/2021 7:23 AM  Julie Austin  has presented today for surgery, with the diagnosis of left renal stone.  The various methods of treatment have been discussed with the patient and family. After consideration of risks, benefits and other options for treatment, the patient has consented to  Procedure(s): NEPHROLITHOTOMY PERCUTANEOUS (Left) as a surgical intervention.  The patient's history has been reviewed, patient examined, no change in status, stable for surgery.  I have reviewed the patient's chart and labs.  Questions were answered to the patient's satisfaction.    RRR CTAB  [name]

## 2021-03-06 NOTE — Transfer of Care (Signed)
Immediate Anesthesia Transfer of Care Note  Patient: Julie Austin  Procedure(s) Performed: NEPHROLITHOTOMY PERCUTANEOUS (Left Back)  Patient Location: PACU  Anesthesia Type:General  Level of Consciousness: drowsy and patient cooperative  Airway & Oxygen Therapy: Patient Spontanous Breathing  Post-op Assessment: Report given to RN and Post -op Vital signs reviewed and stable  Post vital signs: Reviewed and stable  Last Vitals:  Vitals Value Taken Time  BP 103/58 03/06/21 1130  Temp    Pulse 73 03/06/21 1134  Resp 19 03/06/21 1134  SpO2 100 % 03/06/21 1134  Vitals shown include unvalidated device data.  Last Pain:  Vitals:   03/06/21 0657  TempSrc: Oral  PainSc: 0-No pain         Complications: No complications documented.

## 2021-03-07 ENCOUNTER — Other Ambulatory Visit: Payer: Self-pay

## 2021-03-07 ENCOUNTER — Encounter: Payer: Self-pay | Admitting: Urology

## 2021-03-07 DIAGNOSIS — N200 Calculus of kidney: Principal | ICD-10-CM

## 2021-03-07 LAB — CBC
HCT: 28.7 % — ABNORMAL LOW (ref 36.0–46.0)
Hemoglobin: 9.1 g/dL — ABNORMAL LOW (ref 12.0–15.0)
MCH: 25.3 pg — ABNORMAL LOW (ref 26.0–34.0)
MCHC: 31.7 g/dL (ref 30.0–36.0)
MCV: 79.7 fL — ABNORMAL LOW (ref 80.0–100.0)
Platelets: 315 10*3/uL (ref 150–400)
RBC: 3.6 MIL/uL — ABNORMAL LOW (ref 3.87–5.11)
RDW: 16.5 % — ABNORMAL HIGH (ref 11.5–15.5)
WBC: 12.5 10*3/uL — ABNORMAL HIGH (ref 4.0–10.5)
nRBC: 0 % (ref 0.0–0.2)

## 2021-03-07 LAB — BASIC METABOLIC PANEL
Anion gap: 9 (ref 5–15)
BUN: 11 mg/dL (ref 6–20)
CO2: 22 mmol/L (ref 22–32)
Calcium: 8.8 mg/dL — ABNORMAL LOW (ref 8.9–10.3)
Chloride: 106 mmol/L (ref 98–111)
Creatinine, Ser: 0.83 mg/dL (ref 0.44–1.00)
GFR, Estimated: >60 mL/min (ref >60)
Glucose, Bld: 106 mg/dL — ABNORMAL HIGH (ref 70–99)
Potassium: 3.8 mmol/L (ref 3.5–5.1)
Sodium: 137 mmol/L (ref 135–145)

## 2021-03-07 MED ORDER — OXYCODONE-ACETAMINOPHEN 5-325 MG PO TABS: 1.0000 | ORAL_TABLET | ORAL | 0 refills | Status: DC | PRN

## 2021-03-07 MED ORDER — DOCUSATE SODIUM 100 MG PO CAPS: 100.0000 mg | ORAL_CAPSULE | Freq: Two times a day (BID) | ORAL | 0 refills | Status: DC

## 2021-03-07 NOTE — Progress Notes (Signed)
Urology Consult Follow Up  Subjective: Patient resting comfortably.  Nephrostomy draining light pink urine with good output.  Foley draining clear yellow urine.  VSS afebrile.   Creatinine 0.83.  WBC count 12.5.  Stone analysis pending.    Anti-infectives: Anti-infectives (From admission, onward)   Start     Dose/Rate Route Frequency Ordered Stop   03/06/21 2200  ciprofloxacin (CIPRO) tablet 500 mg        500 mg Oral 2 times daily 03/06/21 1334     03/06/21 0914  ceFAZolin (ANCEF) 2-4 GM/100ML-% IVPB       Note to Pharmacy: Mikey Bussing   : cabinet override      03/06/21 0914 03/06/21 0952   03/06/21 0752  ceFAZolin (ANCEF) 2-4 GM/100ML-% IVPB       Note to Pharmacy: Alger Simons   : cabinet override      03/06/21 0752 03/06/21 1959   03/06/21 0700  cefTRIAXone (ROCEPHIN) 2 g in sodium chloride 0.9 % 100 mL IVPB  Status:  Discontinued        2 g 200 mL/hr over 30 Minutes Intravenous  Once 03/06/21 0654 03/06/21 1332   03/06/21 0104  ceFAZolin (ANCEF) IVPB 2g/100 mL premix        2 g 200 mL/hr over 30 Minutes Intravenous 30 min pre-op 03/06/21 0104 03/06/21 0946      Current Facility-Administered Medications  Medication Dose Route Frequency Provider Last Rate Last Admin  . 0.9 %  sodium chloride infusion   Intravenous Continuous [name], MD 100 mL/hr at 03/07/21 0317 Infusion Verify at 03/07/21 0317  . acetaminophen (TYLENOL) tablet 650 mg  650 mg Oral Q4H PRN [name], MD      . belladonna-opium (B&O) suppository 16.2-60mg   1 suppository Rectal Q6H PRN [name], MD      . Chlorhexidine Gluconate Cloth 2 % PADS 6 each  6 each Topical Daily [name], MD      . ciprofloxacin (CIPRO) tablet 500 mg  500 mg Oral BID [name], MD   500 mg at 03/06/21 2014  . diphenhydrAMINE (BENADRYL) injection 12.5 mg  12.5 mg Intravenous Q6H PRN [name], MD       Or  . diphenhydrAMINE (BENADRYL) 12.5 MG/5ML elixir 12.5 mg  12.5 mg Oral Q6H PRN [name], MD      . docusate sodium (COLACE) capsule 100 mg  100 mg Oral BID [name], MD      . heparin injection 5,000 Units  5,000 Units Subcutaneous Q8H [name], MD   5,000 Units at 03/07/21 0522  . morphine 2 MG/ML injection 2-4 mg  2-4 mg Intravenous Q2H PRN [name], MD      . ondansetron [facility]) injection 4 mg  4 mg Intravenous Q4H PRN [name], MD      . oxybutynin (DITROPAN) tablet 5 mg  5 mg Oral Q8H PRN [name], MD      . oxyCODONE-acetaminophen (PERCOCET/ROXICET) 5-325 MG per tablet 1-2 tablet  1-2 tablet Oral Q4H PRN [name], MD   2 tablet at 03/07/21 0522  . zolpidem (AMBIEN) tablet 5 mg  5 mg Oral QHS PRN [name], MD         Objective: Vital signs in last 24 hours: Temp:  [97.1 F (36.2 C)-98.7 F (37.1 C)] 98.3 F (36.8 C) (05/10 0417) Pulse Rate:  [50-79] 79 (05/10 0417) Resp:  [11-21] 16 (05/10 0417) BP: (95-136)/(51-76) 104/64 (05/10 0417) SpO2:  [98 %-100 %]  99 % (05/10 0417)  Intake/Output from previous day: 05/09 0701 - 05/10 0700 In: 2014.7 [P.O.:360; I.V.:1554.7; IV Piggyback:100] Out: 2815 [Urine:2800; Blood:15] Intake/Output this shift: No intake/output data recorded.   Physical Exam Constitutional:  Well nourished. Alert and oriented, No acute distress. HEENT: [place] AT, moist mucus membranes.  Trachea midline Cardiovascular: No clubbing, cyanosis, or edema. Respiratory: Normal respiratory effort, no increased work of breathing. GU: No CVA tenderness.  No bladder fullness or masses.  Nephrostomy dressing clean and dry.  Neurologic: Grossly intact, no focal deficits, moving all 4 extremities. Psychiatric: Normal mood and affect.  Lab Results:  Recent Labs    03/06/21 0719 03/07/21 0221  WBC 4.7 12.5*  HGB 10.3* 9.1*  HCT 32.6* 28.7*  PLT 282 315   BMET Recent Labs    03/07/21 0221  NA 137  K 3.8  CL 106  CO2 22  GLUCOSE 106*  BUN 11  CREATININE 0.83  CALCIUM 8.8*   PT/INR Recent  Labs    03/06/21 0719  LABPROT 13.5  INR 1.0   ABG No results for input(s): PHART, HCO3 in the last 72 hours.  Invalid input(s): PCO2, PO2  Studies/Results: DG Abd 1 View  Result Date: 03/06/2021 CLINICAL DATA:  Surgery EXAM: ABDOMEN - 1 VIEW; DG C-ARM 1-60 MIN COMPARISON:  CT 02/25/2021. FINDINGS: Intraoperative spot images demonstrate injection of contrast through left nephrostomy catheter filling dilated left upper pole renal collecting system. Partially visualized is a stent and guidewire within the left ureter. IMPRESSION: Intraoperative imaging as above. Electronically Signed   By: Charlett Nose M.D.   On: 03/06/2021 11:49   DG C-Arm 1-60 Min  Result Date: 03/06/2021 CLINICAL DATA:  Surgery EXAM: ABDOMEN - 1 VIEW; DG C-ARM 1-60 MIN COMPARISON:  CT 02/25/2021. FINDINGS: Intraoperative spot images demonstrate injection of contrast through left nephrostomy catheter filling dilated left upper pole renal collecting system. Partially visualized is a stent and guidewire within the left ureter. IMPRESSION: Intraoperative imaging as above. Electronically Signed   By: Charlett Nose M.D.   On: 03/06/2021 11:49   DG C-Arm 1-60 Min-No Report  Result Date: 03/06/2021 Fluoroscopy was utilized by the requesting physician.  No radiographic interpretation.   IR CONVERT LEFT NEPHROSTOMY TO NEPHROURETERAL CATH  Result Date: 03/06/2021 INDICATION: Impacted obstructing left UPJ calculus EXAM: IR CONVERT NEPHROMSTOMY TO NEPHROURETERAL CATH LEFT COMPARISON:  02/26/2021 MEDICATIONS: 1 g Rocephin; The antibiotic was administered in an appropriate time frame prior to skin puncture. ANESTHESIA/SEDATION: Fentanyl 50 mcg IV; Versed 1.0 mg IV Moderate Sedation Time:  11 minutes The patient was continuously monitored during the procedure by the interventional radiology nurse under my direct supervision. CONTRAST:  75mL VISIPAQUE IODIXANOL 320 MG/ML IV SOLN - administered into the collecting system(s) FLUOROSCOPY TIME:   Fluoroscopy Time: (49 mGy). COMPLICATIONS: None immediate. PROCEDURE: Informed written consent was obtained from the patient after a thorough discussion of the procedural risks, benefits and alternatives. All questions were addressed. Maximal Sterile Barrier Technique was utilized including caps, mask, sterile gowns, sterile gloves, sterile drape, hand hygiene and skin antiseptic. A timeout was performed prior to the initiation of the procedure. Previous imaging reviewed. Patient positioned prone. Initial fluoroscopic image demonstrates a nephrostomy catheter with the retention loop in the upper pole calyx. Impacted small UPJ staghorn calculus noted. Nephrostomy catheter was cut and removed. [place] catheter advanced. Catheter and guidewire manipulation performed adjacent to the impacted UPJ calculus through the lower pole collecting system. Catheter guidewire access eventually advanced into the ureter. Contrast injection  confirms position. Catheter guidewire advanced into the bladder. Repeat injection confirms position in the bladder. Images obtained for final position. Catheter secured with a silk suture and a sterile dressing. External cap applied. IMPRESSION: Successful conversion of the left nephrostomy catheter to a left nephroureteral catheter for OR access as above. Electronically Signed   By: Judie Petit.  Shick M.D.   On: 03/06/2021 09:46   Catheter Removal  10 ml of water was drained from the balloon. A 16 FR foley cath was removed from the bladder no complications were noted . Patient tolerated well.   Assessment: 40 year old female who underwent left PCNL yesterday for a 3 cm left renal pelvis stone.  Plan: -Nephrostomy tube is capped this a.m. -Foley catheter was removed -We will continue to monitor throughout the morning and if she tolerates the capping of the nephrostomy tube, plans are to remove tubing and discharged home   LOS: 1 day    [facility] 03/07/2021

## 2021-03-07 NOTE — Plan of Care (Signed)
  Problem: Clinical Measurements: Goal: Ability to maintain clinical measurements within normal limits will improve Outcome: Progressing Goal: Will remain free from infection Outcome: Progressing Goal: Diagnostic test results will improve Outcome: Progressing Goal: Respiratory complications will improve Outcome: Progressing Goal: Cardiovascular complication will be avoided Outcome: Progressing   Problem: Pain Managment: Goal: General experience of comfort will improve Outcome: Progressing   Problem: Safety: Goal: Ability to remain free from injury will improve Outcome: Progressing  Pt is involved in and agrees with the plan of care. V/S stable. Complained of cramping on her abdomen @ 0522; oxycodone given for relief. Nephrostomy tube in place; draining pink-tinged urine output. Dressing dry and intact. FC in place; draining good amount of urine.

## 2021-03-07 NOTE — Plan of Care (Signed)
  Problem: Health Behavior/Discharge Planning: Goal: Ability to manage health-related needs will improve Outcome: Progressing   Problem: Clinical Measurements: Goal: Will remain free from infection Outcome: Progressing   Problem: Activity: Goal: Risk for activity intolerance will decrease Outcome: Progressing   Problem: Nutrition: Goal: Adequate nutrition will be maintained Outcome: Progressing   Problem: Pain Managment: Goal: General experience of comfort will improve Outcome: Progressing   Problem: Skin Integrity: Goal: Risk for impaired skin integrity will decrease Outcome: Progressing

## 2021-03-07 NOTE — Discharge Summary (Signed)
Date of admission: 03/06/2021  Date of discharge: 03/07/2021  Admission diagnosis: Left kidney stone  Discharge diagnosis: Left kidney stone  Secondary diagnoses:  Patient Active Problem List   Diagnosis Date Noted  . Left nephrolithiasis 03/06/2021  . Acute pyelonephritis 02/27/2021  . Hydronephrosis with urinary obstruction due to ureteral calculus   . Dehydration   . Kidney stone on left side 02/25/2021  . Urinary tract infection 02/25/2021  . Sepsis ([place]) 02/25/2021  . Left flank pain 02/25/2021  . Nausea & vomiting 02/25/2021  . Acute hyponatremia 02/25/2021  . Microcytic anemia 02/25/2021  . Overweight BMI=27.9 12/27/2020  . hx of gestational diabetes 2016 12/27/2020  . hx pp depression 2016 12/27/2020    History and Physical: For full details, please see admission history and physical. Briefly, Julie Austin is a 40 y.o. year old patient with large left kidney admitted following uncomplicated PCNL.   Hospital Course: Patient tolerated the procedure well.  She was then transferred to the floor after an uneventful PACU stay.  Her hospital course was uncomplicated.  On POD#1 she had met discharge criteria: was eating a regular diet, was up and ambulating independently,  pain was well controlled, was voiding without a catheter, and was ready to for discharge.   Laboratory values:  Recent Labs    03/06/21 0719 03/07/21 0221  WBC 4.7 12.5*  HGB 10.3* 9.1*  HCT 32.6* 28.7*   Recent Labs    03/07/21 0221  NA 137  K 3.8  CL 106  CO2 22  GLUCOSE 106*  BUN 11  CREATININE 0.83  CALCIUM 8.8*   Recent Labs    03/06/21 0719  INR 1.0   No results for input(s): LABURIN in the last 72 hours. Results for orders placed or performed during the hospital encounter of 03/02/21  SARS CORONAVIRUS 2 (TAT 6-24 HRS) Nasopharyngeal Nasopharyngeal Swab     Status: None   Collection Time: 03/02/21 11:07 AM   Specimen: Nasopharyngeal Swab  Result Value Ref Range Status    SARS Coronavirus 2 NEGATIVE NEGATIVE Final    Comment: (NOTE) SARS-CoV-2 target nucleic acids are NOT DETECTED.  The SARS-CoV-2 RNA is generally detectable in upper and lower respiratory specimens during the acute phase of infection. Negative results do not preclude SARS-CoV-2 infection, do not rule out co-infections with other pathogens, and should not be used as the sole basis for treatment or other patient management decisions. Negative results must be combined with clinical observations, patient history, and epidemiological information. The expected result is Negative.  Fact Sheet for Patients: SugarRoll.be  Fact Sheet for Healthcare Providers: https://www.woods-mathews.com/  This test is not yet approved or cleared by the [place] FDA and  has been authorized for detection and/or diagnosis of SARS-CoV-2 by FDA under an Emergency Use Authorization (EUA). This EUA will remain  in effect (meaning this test can be used) for the duration of the COVID-19 declaration under Se ction 564(b)(1) of the Act, 21 U.S.C. section 360bbb-3(b)(1), unless the authorization is terminated or revoked sooner.  Performed at [facility], [place] [address], [place], [place] [address]     Disposition: Home   Discharge medications:  Allergies as of 03/07/2021   No Known Allergies     Medication List    TAKE these medications   cholecalciferol 25 MCG (1000 UNIT) tablet Commonly known as: VITAMIN D3 Take 1,000 Units by mouth daily.   ciprofloxacin 500 MG tablet Commonly known as: CIPRO Take 1 tablet (500 mg total)  by mouth 2 (two) times daily for 14 days.   docusate sodium 100 MG capsule Commonly known as: COLACE Take 1 capsule (100 mg total) by mouth 2 (two) times daily.   ibuprofen 200 MG tablet Commonly known as: ADVIL Take 400 mg by mouth every 6 (six) hours as needed for moderate pain or mild pain.   multivitamin tablet Take 1  tablet by mouth daily.   ondansetron 4 MG tablet Commonly known as: Zofran Take 1 tablet (4 mg total) by mouth daily as needed for nausea or vomiting.   oxyCODONE-acetaminophen 5-325 MG tablet Commonly known as: Percocet Take 1-2 tablets by mouth every 4 (four) hours as needed for moderate pain or severe pain.   traMADol 50 MG tablet Commonly known as: Ultram Take 1 tablet (50 mg total) by mouth every 6 (six) hours as needed.   vitamin C 100 MG tablet Take 100 mg by mouth daily.       Followup:   Follow-up Information    Hollice Espy, MD. Go on 04/18/2021.   Specialty: Urology Why: 2:15pm appointment Contact information: [place] [address] [phone]

## 2021-03-07 NOTE — Progress Notes (Signed)
Julie Austin to be D/C'd Home per MD order.  Discussed prescriptions and follow up appointments with the patient. Prescriptions given to patient, medication list explained in detail. Educated patient's son on how to cleanse and change dressing on lower back from nephrostomy tube removed earlier. Patient given supplies for hourly or as needed dressing changes. Patient given coupon for pain medication.   Pt verbalized understanding.  Allergies as of 03/07/2021   No Known Allergies     Medication List    TAKE these medications   cholecalciferol 25 MCG (1000 UNIT) tablet Commonly known as: VITAMIN D3 Take 1,000 Units by mouth daily.   ciprofloxacin 500 MG tablet Commonly known as: CIPRO Take 1 tablet (500 mg total) by mouth 2 (two) times daily for 14 days.   docusate sodium 100 MG capsule Commonly known as: COLACE Take 1 capsule (100 mg total) by mouth 2 (two) times daily.   ibuprofen 200 MG tablet Commonly known as: ADVIL Take 400 mg by mouth every 6 (six) hours as needed for moderate pain or mild pain.   multivitamin tablet Take 1 tablet by mouth daily.   ondansetron 4 MG tablet Commonly known as: Zofran Take 1 tablet (4 mg total) by mouth daily as needed for nausea or vomiting.   oxyCODONE-acetaminophen 5-325 MG tablet Commonly known as: Percocet Take 1-2 tablets by mouth every 4 (four) hours as needed for moderate pain or severe pain.   traMADol 50 MG tablet Commonly known as: Ultram Take 1 tablet (50 mg total) by mouth every 6 (six) hours as needed.   vitamin C 100 MG tablet Take 100 mg by mouth daily.       Vitals:   03/07/21 0914 03/07/21 1136  BP: (!) 95/51 101/60  Pulse: 61 63  Resp: 16 18  Temp: 97.9 F (36.6 C) 98.5 F (36.9 C)  SpO2: 100% 99%    Skin clean, dry and intact without evidence of skin break down, no evidence of skin tears noted. IV catheter discontinued intact. Site without signs and symptoms of complications. Dressing and pressure  applied. Pt denies pain at this time. No complaints noted.  An After Visit Summary was printed and given to the patient. Patient escorted via WC, and D/C home via private auto.  Madie Reno, RN

## 2021-03-07 NOTE — Discharge Instructions (Signed)
Percutaneous Nephrolithotomy, Care After This sheet gives you information about how to care for yourself after your procedure. Your health care provider may also give you more specific instructions. If you have problems or questions, contact your health care provider. What can I expect after the procedure? After the procedure, it is common to have:  Soreness or pain.  A small amount of blood or clear fluid coming from your incision for a few days.  Fatigue.  Some blood in your urine. This will last for a few days. Follow these instructions at home: Medicines  Take over-the-counter and prescription medicines only as told by your health care provider.  If you were prescribed an antibiotic medicine, take it as told by your health care provider. Do not stop using the antibiotic even if you start to feel better.  Ask your health care provider if the medicine prescribed to you: ? Requires you to avoid driving or using heavy machinery. ? Can cause constipation. You may need to take these actions to prevent or treat constipation:  Drink enough fluid to keep your urine pale yellow.  Take over-the-counter or prescription medicines.  Eat foods that are high in fiber, such as beans, whole grains, and fresh fruits and vegetables.  Limit foods that are high in fat and processed sugars, such as fried or sweet foods. Incision care  Follow instructions from your health care provider about how to take care of your incision. Make sure you: ? Wash your hands with soap and water before and after you change your bandage (dressing). If soap and water are not available, use hand sanitizer. ? Change your dressing as told by your health care provider. ? Leave stitches (sutures), skin glue, or adhesive strips in place. These skin closures may need to stay in place for 2 weeks or longer. If adhesive strip edges start to loosen and curl up, you may trim the loose edges. Do not remove adhesive strips completely  unless your health care provider tells you to do that.  Check your incision area every day for signs of infection. Check for: ? Redness, swelling, or pain. ? More fluid or blood. ? Warmth. ? Pus or a bad smell.  You may shower as long as the incision is covered with a Tegaderm or water occlusive bandage.  Once the incision is scabbed, no longer needs to be covered.   Activity  Avoid strenuous activities for as long as told by your health care provider.  Return to your normal activities as told by your health care provider. Ask your health care provider what activities are safe for you. General instructions  If you were sent home with a catheter or surgical drain (nephrostomy tube), follow your health care provider's instructions on how to take care of it.  Wear compression stockings as told by your health care provider. These stockings help to prevent blood clots and reduce swelling in your legs.  Do not use any products that contain nicotine or tobacco, such as cigarettes, e-cigarettes, and chewing tobacco. These can delay incision healing after surgery. If you need help quitting, ask your health care provider.  Keep all follow-up visits as told by your health care provider. This is important. ? If you still have a stent, your health care provider will need to remove it after 1-2 weeks. Contact a health care provider if:  You have a fever.  You have redness, swelling, or pain around your incision.  You have more fluid or blood coming from  your incision.  Your incision feels warm to the touch.  You have pus or a bad smell coming from your incision.  You lose your appetite.  You feel nauseous or you vomit.  You have been sent home with a urinary catheter or a surgical drain and urine flow suddenly stops, followed by kidney pain. Get help right away if:  You notice a large amount of blood in your urine.  You have blood clots in your urine.  You cannot urinate.  You have  chest pain or trouble breathing. Summary  After the procedure, it is common to feel soreness and discomfort. You may also see blood in your urine.  You will be told how to care for yourself after the procedure. Follow instructions on how to care for your incision and how to recognize signs of infection.  Avoid activities that require great effort. Return to activities as told by your health care provider.  Get help right away if you have blood clots in your urine, you cannot urinate, or you have trouble breathing. This information is not intended to replace advice given to you by your health care provider. Make sure you discuss any questions you have with your health care provider. Document Revised: 05/07/2018 Document Reviewed: 05/07/2018 Elsevier Patient Education  2021 [employer].

## 2021-03-10 LAB — CALCULI, WITH PHOTOGRAPH (CLINICAL LAB)
Calcium Oxalate Dihydrate: 40 %
Calcium Oxalate Monohydrate: 40 %
Hydroxyapatite: 20 %
Weight Calculi: 1621 mg

## 2021-03-14 ENCOUNTER — Encounter: Payer: Self-pay | Admitting: Urology

## 2021-04-12 ENCOUNTER — Encounter: Payer: Self-pay | Admitting: Advanced Practice Midwife

## 2021-04-12 ENCOUNTER — Ambulatory Visit: Payer: Self-pay | Admitting: Advanced Practice Midwife

## 2021-04-12 ENCOUNTER — Other Ambulatory Visit: Payer: Self-pay

## 2021-04-12 DIAGNOSIS — T7412XS Child physical abuse, confirmed, sequela: Secondary | ICD-10-CM

## 2021-04-12 DIAGNOSIS — T7412XA Child physical abuse, confirmed, initial encounter: Secondary | ICD-10-CM

## 2021-04-12 DIAGNOSIS — T7421XS Adult sexual abuse, confirmed, sequela: Secondary | ICD-10-CM

## 2021-04-12 DIAGNOSIS — Z62819 Personal history of unspecified abuse in childhood: Secondary | ICD-10-CM

## 2021-04-12 DIAGNOSIS — T7421XA Adult sexual abuse, confirmed, initial encounter: Secondary | ICD-10-CM | POA: Insufficient documentation

## 2021-04-12 DIAGNOSIS — Z62810 Personal history of physical and sexual abuse in childhood: Secondary | ICD-10-CM

## 2021-04-12 DIAGNOSIS — Z113 Encounter for screening for infections with a predominantly sexual mode of transmission: Secondary | ICD-10-CM

## 2021-04-12 HISTORY — DX: Personal history of unspecified abuse in childhood: Z62.819

## 2021-04-12 HISTORY — DX: Child physical abuse, confirmed, initial encounter: T74.12XA

## 2021-04-12 HISTORY — DX: Adult sexual abuse, confirmed, initial encounter: T74.21XA

## 2021-04-12 LAB — WET PREP FOR TRICH, YEAST, CLUE
Trichomonas Exam: NEGATIVE
Yeast Exam: NEGATIVE
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
## 2021-04-12 NOTE — Progress Notes (Signed)
Pt here for STD screening.  Wet mount results reviewed, no medication required. Zayra Devito M Zimri Brennen, RN  

## 2021-04-18 ENCOUNTER — Ambulatory Visit
Admission: RE | Admit: 2021-04-18 | Discharge: 2021-04-18 | Disposition: A | Discharge status: Home / Self Care | Payer: Self-pay | Attending: Urology | Admitting: Urology

## 2021-04-18 ENCOUNTER — Ambulatory Visit
Admission: RE | Admit: 2021-04-18 | Discharge: 2021-04-18 | Disposition: A | Discharge status: Home / Self Care | Payer: Self-pay | Source: Ambulatory Visit | Attending: Urology | Admitting: Urology

## 2021-04-18 ENCOUNTER — Other Ambulatory Visit: Payer: Self-pay

## 2021-04-18 ENCOUNTER — Ambulatory Visit (INDEPENDENT_AMBULATORY_CARE_PROVIDER_SITE_OTHER): Payer: Self-pay | Admitting: Urology

## 2021-04-18 ENCOUNTER — Encounter: Payer: Self-pay | Admitting: Urology

## 2021-04-18 VITALS — BP 108/73 | HR 71 | Ht 62.0 in | Wt 147.0 lb

## 2021-04-18 DIAGNOSIS — N200 Calculus of kidney: Secondary | ICD-10-CM

## 2021-04-18 NOTE — Progress Notes (Signed)
04/18/2021 4:34 PM   Julie Austin [birth year] [id number]  Referring provider: Center, [facility] [address] [place],  [place] [address]  Chief Complaint  Patient presents with   Nephrolithiasis    Follow up w/KUB    HPI: 40 year old female who presents today for follow-up after left PCNL.  Please see previous notes for details.  In summary, she presented with septic obstructing stone and underwent percutaneous nephrostomy tube placement.  She returned to the operating room on 03/06/2021 for uncomplicated left PCNL.  All tubes were removed prior to discharge on postop day 1 and she returns today for visit.  She reports that she has been doing very well.  The incision healed fairly quickly and she is not having any flank pain or urinary symptoms.  KUB today does show multiple stones in the right consistent with what appears to be nephrocalcinosis on her original CT scan.  There are also a few fragments especially in the lower pole on the left side none which measure greater than about 2 mm.  No personal history of kidney stones prior to this.  Stone analysis consistent with 40% calcium oxalate monohydrate, 40% calcium oxalate dihydrate and 20% hydro-Apatate.    PMH: Past Medical History:  Diagnosis Date   Anemia    Blurry vision    wears glasses   History of anemia    History of gestational diabetes    History of urinary tract infection    Vaginal itching    Weight gain     Surgical History: Past Surgical History:  Procedure Laterality Date   IR CONVERT LEFT NEPHROSTOMY TO NEPHROURETERAL CATH  03/06/2021   IR NEPHROSTOMY PLACEMENT LEFT  02/26/2021   LEEP     [facility] (thinks this was procedure as done under general anesthesia due to an abnormal PAP)   NEPHROLITHOTOMY Left 03/06/2021   Procedure: NEPHROLITHOTOMY PERCUTANEOUS;  Surgeon: [name], MD;  Location: [facility] ORS;  Service: Urology;  Laterality: Left;    Home Medications:  Allergies as of  04/18/2021   No Known Allergies      Medication List        Accurate as of April 18, 2021  4:34 PM. If you have any questions, ask your nurse or doctor.          STOP taking these medications    docusate sodium 100 MG capsule Commonly known as: COLACE Stopped by: [name], MD   ibuprofen 200 MG tablet Commonly known as: ADVIL Stopped by: [name], MD   ondansetron 4 MG tablet Commonly known as: Zofran Stopped by: [name], MD   oxyCODONE-acetaminophen 5-325 MG tablet Commonly known as: Percocet Stopped by: [name], MD   traMADol 50 MG tablet Commonly known as: Ultram Stopped by: [name], MD       TAKE these medications    cholecalciferol 25 MCG (1000 UNIT) tablet Commonly known as: VITAMIN D3 Take 1,000 Units by mouth daily.   multivitamin tablet Take 1 tablet by mouth daily.   vitamin C 100 MG tablet Take 100 mg by mouth daily.        Allergies: No Known Allergies  Family History: Family History  Problem Relation Age of Onset   Diabetes Paternal Grandfather    Diabetes Paternal Grandmother    Diabetes Maternal Grandmother    Heart disease Maternal Grandmother    Diabetes Maternal Grandfather    Diabetes Father    Migraines Mother    Kidney disease Son  Social History:  reports that she has never smoked. She has never used smokeless tobacco. She reports current alcohol use of about 2.0 standard drinks of alcohol per week. She reports that she does not use drugs.   Physical Exam: BP 108/73   Pulse 71   Ht 5\' 2"  (1.575 m)   Wt 147 lb (66.7 kg)   BMI 26.89 kg/m   Constitutional:  Alert and oriented, No acute distress. HEENT: [place] AT, moist mucus membranes.  Trachea midline, no masses. Cardiovascular: No clubbing, cyanosis, or edema. Respiratory: Normal respiratory effort, no increased work of breathing. GU: Left flank incision now well-healed scab Skin: No rashes, bruises or suspicious  lesions. Neurologic: Grossly intact, no focal deficits, moving all 4 extremities. Psychiatric: Normal mood and affect.  Laboratory Data: Lab Results  Component Value Date   WBC 12.5 (H) 03/07/2021   HGB 9.1 (L) 03/07/2021   HCT 28.7 (L) 03/07/2021   MCV 79.7 (L) 03/07/2021   PLT 315 03/07/2021    Lab Results  Component Value Date   CREATININE 0.83 03/07/2021   Pertinent Imaging: KUB today was personally reviewed, awaiting radiologic interpretation.  She is stable stone burden on the right and interval resolution of her large right UPJ stone.  There are few fragments in her right lower pole and midpole none measuring greater than about 2 mm.  Assessment & Plan:    1. Kidney stones Status post successful left ESWL with a few small residual fragments which may be intraparenchymal  Would recommend observation for residual stone burden, plan for KUB in about 6 months to reassess  Given the significant bilateral stone disease, she would likely benefit for 24-hour urine metabolic evaluation and possibly treatment for stone suppression if deemed appropriate  We discussed general stone prevention techniques including drinking plenty water with goal of producing 2.5 L urine daily, increased citric acid intake, avoidance of high oxalate containing foods, and decreased salt intake.  Information about dietary recommendations given today.  Plan for follow-up in about 6 weeks for 24 urine evaluation.    Return for 6wk w/24hour urine results.  05/07/2021, MD  [facility] [address] [place], [place] [phone]

## 2021-04-18 NOTE — Patient Instructions (Signed)

## 2021-04-26 ENCOUNTER — Telehealth: Payer: Self-pay | Admitting: Family Medicine

## 2021-04-26 NOTE — Telephone Encounter (Signed)
Phone call to pt. Pt confirmed password. Discussed test results from last visit.

## 2021-04-26 NOTE — Telephone Encounter (Signed)
Patient called to see if her results were in yet. Let her know that I'd leave her name and number with the nurse to call her back to go over her results.

## 2021-06-01 ENCOUNTER — Ambulatory Visit: Payer: Self-pay | Admitting: Physician Assistant

## 2021-06-05 ENCOUNTER — Other Ambulatory Visit: Payer: Self-pay

## 2021-06-09 ENCOUNTER — Other Ambulatory Visit: Payer: Self-pay | Admitting: Urology

## 2021-06-22 ENCOUNTER — Ambulatory Visit: Payer: Self-pay | Admitting: Urology

## 2021-07-05 ENCOUNTER — Ambulatory Visit: Payer: Self-pay | Admitting: Urology

## 2021-07-06 ENCOUNTER — Ambulatory Visit (LOCAL_COMMUNITY_HEALTH_CENTER): Payer: Self-pay

## 2021-07-06 ENCOUNTER — Other Ambulatory Visit: Payer: Self-pay

## 2021-07-06 VITALS — BP 103/66 | Ht 62.0 in | Wt 146.5 lb

## 2021-07-06 DIAGNOSIS — Z3201 Encounter for pregnancy test, result positive: Secondary | ICD-10-CM

## 2021-07-06 MED ORDER — PRENATAL VITAMIN 27-0.8 MG PO TABS: 1.0000 | ORAL_TABLET | ORAL | 0 refills | Status: AC
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
## 2021-07-07 LAB — PREGNANCY, URINE: Preg Test, Ur: POSITIVE — AB

## 2021-07-17 NOTE — Progress Notes (Signed)
07/18/21 9:57 AM   Julie Austin [birth year] [id number]  Referring provider:  Center, [facility] [address] [place],  [place] [address] Chief Complaint  Patient presents with   Nephrolithiasis     HPI: Julie Austin is a 40 y.o.female with a personal history of PCNL, who presents today for results.   Since last visit, she recently has learned that she is pregnant.  She is s/p percutaneous nephrostomy tube placement for septic obstructing stone. On 03/06/2021 for uncomplicated left PCNL.   04/18/2021 KUB showed multiple small calculi overlying the mid and lower pole of the kidneys bilaterally. There are also a few fragments especially in the lower pole none of which measure greater than about 2 mm.   Stone analysis consistent with 40% calcium oxalate monohydrate, 40% calcium oxalate dihydrate and 20% hydro-Apatate.  Her Litholink results showed her urine volume was excellent at 3.6 liters. Urine calcium was markedly elevated at 455 mg per day. Her urinary pH was high at 7.0.   She reports today that she is doing well. She states that she takes vitamin D supplements due to her workout routine.    PMH: Past Medical History:  Diagnosis Date   Anemia    Blurry vision    wears glasses   History of anemia    History of gestational diabetes    History of urinary tract infection    Vaginal itching    Weight gain     Surgical History: Past Surgical History:  Procedure Laterality Date   IR CONVERT LEFT NEPHROSTOMY TO NEPHROURETERAL CATH  03/06/2021   IR NEPHROSTOMY PLACEMENT LEFT  02/26/2021   LEEP     [facility] (thinks this was procedure as done under general anesthesia due to an abnormal PAP)   NEPHROLITHOTOMY Left 03/06/2021   Procedure: NEPHROLITHOTOMY PERCUTANEOUS;  Surgeon: [name], MD;  Location: [facility] ORS;  Service: Urology;  Laterality: Left;    Home Medications:  Allergies as of 07/18/2021   No Known Allergies      Medication List         Accurate as of July 18, 2021  9:57 AM. If you have any questions, ask your nurse or doctor.          cholecalciferol 25 MCG (1000 UNIT) tablet Commonly known as: VITAMIN D3 Take 1,000 Units by mouth daily.   ferrous sulfate 325 (65 FE) MG tablet Take 325 mg by mouth daily with breakfast. Reported by patient   multivitamin tablet Take 1 tablet by mouth daily.   vitamin C 100 MG tablet Take 100 mg by mouth daily.        Allergies: No Known Allergies  Family History: Family History  Problem Relation Age of Onset   Diabetes Paternal Grandfather    Diabetes Paternal Grandmother    Diabetes Maternal Grandmother    Heart disease Maternal Grandmother    Diabetes Maternal Grandfather    Diabetes Father    Migraines Mother    Kidney disease Son     Social History:  reports that she has never smoked. She has never used smokeless tobacco. She reports current alcohol use of about 2.0 standard drinks per week. She reports that she does not use drugs.   Physical Exam: BP 106/63   Pulse 76   Ht 5\' 2"  (1.575 m)   Wt 146 lb (66.2 kg)   LMP 04/12/2021 (Exact Date)   BMI 26.70 kg/m   Constitutional:  Alert and oriented, No acute distress. HEENT: [place] AT,  moist mucus membranes.  Trachea midline, no masses. Cardiovascular: No clubbing, cyanosis, or edema. Respiratory: Normal respiratory effort, no increased work of breathing. Skin: No rashes, bruises or suspicious lesions. Neurologic: Grossly intact, no focal deficits, moving all 4 extremities. Psychiatric: Normal mood and affect.  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    -Underlying malignancy, sarcoidosis and other etiologies of hyperparathyroidism significantly less likely due to her age and overall excellent  health  - Would benefit from checking thyroid and parathyroid.   - TSH; pending  - PTH; pending   - Would benefit in the future on indapamide thiazide to lower her urinary calcium. She is currently pregnant so we are hesitant to start a new medication.    Return in about 1 year (around 07/18/2022).  Tawni Millers as a Neurosurgeon for [name], MD.,have documented all relevant documentation on the behalf of [name], MD,as directed by  [name], MD while in the presence of [name], MD.  I have reviewed the above documentation for accuracy and completeness, and I agree with the above.   [name], MD  [facility] [address] [place], [place] [address] [phone]

## 2021-07-18 ENCOUNTER — Encounter: Payer: Self-pay | Admitting: Urology

## 2021-07-18 ENCOUNTER — Other Ambulatory Visit: Payer: Self-pay

## 2021-07-18 ENCOUNTER — Ambulatory Visit (INDEPENDENT_AMBULATORY_CARE_PROVIDER_SITE_OTHER): Payer: Medicaid Other | Admitting: Urology

## 2021-07-18 VITALS — BP 106/63 | HR 76 | Ht 62.0 in | Wt 146.0 lb

## 2021-07-18 DIAGNOSIS — N200 Calculus of kidney: Secondary | ICD-10-CM

## 2021-07-19 LAB — TSH: TSH: 4.47 u[IU]/mL (ref 0.450–4.500)

## 2021-07-19 LAB — PARATHYROID HORMONE, INTACT (NO CA): PTH: 25 pg/mL (ref 15–65)

## 2021-07-20 ENCOUNTER — Telehealth: Payer: Self-pay | Admitting: *Deleted

## 2021-07-20 NOTE — Telephone Encounter (Signed)
-----   Message from [name], MD sent at 07/20/2021  8:40 AM EDT ----- Labs are all fine, good news.   [name], MD

## 2021-07-20 NOTE — Progress Notes (Signed)
OB abstraction completed per phone interview.  Plans to keep new OB appt 07/21/21 8 am Henriette Combs RN

## 2021-07-20 NOTE — Telephone Encounter (Signed)
Patient informed, voiced understanding.  °

## 2021-07-21 ENCOUNTER — Encounter: Payer: Self-pay | Admitting: Family Medicine

## 2021-07-21 ENCOUNTER — Other Ambulatory Visit: Payer: Self-pay

## 2021-07-21 ENCOUNTER — Ambulatory Visit: Payer: Medicaid Other | Admitting: Family Medicine

## 2021-07-21 VITALS — BP 94/59 | HR 68 | Temp 97.4°F | Wt 149.0 lb

## 2021-07-21 DIAGNOSIS — Z62819 Personal history of unspecified abuse in childhood: Secondary | ICD-10-CM

## 2021-07-21 DIAGNOSIS — O99212 Obesity complicating pregnancy, second trimester: Secondary | ICD-10-CM | POA: Diagnosis not present

## 2021-07-21 DIAGNOSIS — Z8659 Personal history of other mental and behavioral disorders: Secondary | ICD-10-CM

## 2021-07-21 DIAGNOSIS — O09529 Supervision of elderly multigravida, unspecified trimester: Secondary | ICD-10-CM | POA: Insufficient documentation

## 2021-07-21 DIAGNOSIS — O09522 Supervision of elderly multigravida, second trimester: Secondary | ICD-10-CM

## 2021-07-21 DIAGNOSIS — Z8632 Personal history of gestational diabetes: Secondary | ICD-10-CM | POA: Diagnosis not present

## 2021-07-21 DIAGNOSIS — O99210 Obesity complicating pregnancy, unspecified trimester: Secondary | ICD-10-CM | POA: Insufficient documentation

## 2021-07-21 DIAGNOSIS — O0990 Supervision of high risk pregnancy, unspecified, unspecified trimester: Secondary | ICD-10-CM | POA: Insufficient documentation

## 2021-07-21 DIAGNOSIS — Z87442 Personal history of urinary calculi: Secondary | ICD-10-CM

## 2021-07-21 LAB — URINALYSIS
Bilirubin, UA: NEGATIVE
Glucose, UA: NEGATIVE
Ketones, UA: NEGATIVE
Leukocytes,UA: NEGATIVE
Nitrite, UA: NEGATIVE
Protein,UA: NEGATIVE
RBC, UA: NEGATIVE
Specific Gravity, UA: 1.02 (ref 1.005–1.030)
Urobilinogen, Ur: 0.2 mg/dL (ref 0.2–1.0)
pH, UA: 7 (ref 5.0–7.5)

## 2021-07-21 LAB — WET PREP FOR TRICH, YEAST, CLUE
Trichomonas Exam: NEGATIVE
Yeast Exam: NEGATIVE

## 2021-07-21 LAB — HEMOGLOBIN, FINGERSTICK: Hemoglobin: 11.6 g/dL (ref 11.1–15.9)

## 2021-07-21 MED ORDER — PRENATAL MULTIVITAMIN CH: 1.0000 | ORAL_TABLET | Freq: Every day | ORAL | 0 refills | Status: DC
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
## 2021-07-21 NOTE — Progress Notes (Signed)
Patient here approx [redacted]w[redacted]d for initial visit.  Patient believes last LMP was mid July but unsure of exact date. Period was more like a 3 day spotting with mostly brown discharge. LMP updated in chart and provider made aware.   No QFT indicated. PPD @ [facility] with 27mm on 12/17/11 = negative. No international travels since then per patient.   [facility] contact card given 07/21/21.   Patient is Jehova's Witness but states ok with receiving blood products if medically necessary.   Wet mount and hgb reviewed during clinic visit - no treatment indicated.   Patient aware she will receive a call from [facility] for dating ultrasound appointment.   Floy Sabina, RN

## 2021-07-22 LAB — 789231 7+OXYCODONE-BUND
Amphetamines, Urine: NEGATIVE ng/mL
BENZODIAZ UR QL: NEGATIVE ng/mL
Barbiturate screen, urine: NEGATIVE ng/mL
Cannabinoid Quant, Ur: NEGATIVE ng/mL
Cocaine (Metab.): NEGATIVE ng/mL
OPIATE SCREEN URINE: NEGATIVE ng/mL
Oxycodone/Oxymorphone, Urine: NEGATIVE ng/mL
PCP Quant, Ur: NEGATIVE ng/mL

## 2021-07-22 LAB — CBC/D/PLT+RPR+RH+ABO+AB SCR
Antibody Screen: NEGATIVE
Basophils Absolute: 0.1 10*3/uL (ref 0.0–0.2)
Basos: 1 %
EOS (ABSOLUTE): 0.2 10*3/uL (ref 0.0–0.4)
Eos: 3 %
Hematocrit: 36.7 % (ref 34.0–46.6)
Hemoglobin: 11.8 g/dL (ref 11.1–15.9)
Hepatitis B Surface Ag: NEGATIVE
Immature Grans (Abs): 0 10*3/uL (ref 0.0–0.1)
Immature Granulocytes: 1 %
Lymphocytes Absolute: 1.4 10*3/uL (ref 0.7–3.1)
Lymphs: 24 %
MCH: 26.2 pg — ABNORMAL LOW (ref 26.6–33.0)
MCHC: 32.2 g/dL (ref 31.5–35.7)
MCV: 82 fL (ref 79–97)
Monocytes Absolute: 0.3 10*3/uL (ref 0.1–0.9)
Monocytes: 5 %
Neutrophils Absolute: 4 10*3/uL (ref 1.4–7.0)
Neutrophils: 66 %
Platelets: 312 10*3/uL (ref 150–450)
RBC: 4.5 x10E6/uL (ref 3.77–5.28)
RDW: 16.6 % — ABNORMAL HIGH (ref 11.7–15.4)
RPR Ser Ql: NONREACTIVE
Rh Factor: POSITIVE
WBC: 6 10*3/uL (ref 3.4–10.8)

## 2021-07-22 LAB — HCV INTERPRETATION

## 2021-07-22 LAB — GLUCOSE, 1 HOUR GESTATIONAL: Gestational Diabetes Screen: 146 mg/dL — ABNORMAL HIGH (ref 65–139)

## 2021-07-22 LAB — HGB A1C W/O EAG: Hgb A1c MFr Bld: 5.8 % — ABNORMAL HIGH (ref 4.8–5.6)

## 2021-07-22 LAB — HCV AB W REFLEX TO QUANT PCR: HCV Ab: <0.1 s/co ratio (ref 0.0–0.9)

## 2021-07-24 ENCOUNTER — Telehealth: Payer: Self-pay

## 2021-07-24 DIAGNOSIS — O99810 Abnormal glucose complicating pregnancy: Secondary | ICD-10-CM | POA: Insufficient documentation

## 2021-07-24 DIAGNOSIS — Z87442 Personal history of urinary calculi: Secondary | ICD-10-CM | POA: Insufficient documentation

## 2021-07-24 DIAGNOSIS — Z8632 Personal history of gestational diabetes: Secondary | ICD-10-CM | POA: Insufficient documentation

## 2021-07-24 DIAGNOSIS — Z8659 Personal history of other mental and behavioral disorders: Secondary | ICD-10-CM | POA: Insufficient documentation

## 2021-07-24 LAB — HIV-1/HIV-2 QUALITATIVE RNA
HIV-1 RNA, Qualitative: NONREACTIVE
HIV-2 RNA, Qualitative: NONREACTIVE

## 2021-07-24 LAB — LEAD, BLOOD (ADULT >= 16 YRS): Lead-Whole Blood: <1 ug/dL (ref 0–4)

## 2021-07-24 NOTE — Telephone Encounter (Signed)
Call to client as needs 3 hour GTT (1 hr = 146). Left message to call regarding test result and need for further testing. Number to call provided. Jossie Ng, RN

## 2021-07-24 NOTE — Telephone Encounter (Signed)
Return call from client and 3 hour GTT scheduled for 08/01/2021 with arrival time of 0800. Test prep instructions reviewed with stated understanding. Jossie Ng, RN

## 2021-07-26 LAB — URINE CULTURE

## 2021-07-26 LAB — CHLAMYDIA/GC NAA, CONFIRMATION
Chlamydia trachomatis, NAA: NEGATIVE
Neisseria gonorrhoeae, NAA: NEGATIVE

## 2021-08-01 ENCOUNTER — Other Ambulatory Visit: Payer: Self-pay

## 2021-08-01 ENCOUNTER — Other Ambulatory Visit: Payer: Medicaid Other

## 2021-08-01 DIAGNOSIS — O99810 Abnormal glucose complicating pregnancy: Secondary | ICD-10-CM

## 2021-08-01 NOTE — Progress Notes (Signed)
In  Nurse Clinic for 3 hr GTT. Pt reports npo since 7 pm last night. Instructions given for test today. Advised to notify RN if n/v. Questions answered and reports understanding. Jerel Shepherd, RN

## 2021-08-02 LAB — GLUCOSE TOLERANCE TEST, 6 HOUR
Glucose, 1 Hour GTT: 169 mg/dL (ref 65–199)
Glucose, 2 hour: 129 mg/dL (ref 65–139)
Glucose, 3 hour: 120 mg/dL — ABNORMAL HIGH (ref 65–109)
Glucose, GTT - Fasting: 79 mg/dL (ref 65–99)

## 2021-08-18 ENCOUNTER — Ambulatory Visit: Payer: Medicaid Other | Admitting: Family Medicine

## 2021-08-18 ENCOUNTER — Other Ambulatory Visit: Payer: Self-pay

## 2021-08-18 VITALS — BP 106/68 | HR 81 | Temp 97.4°F | Wt 151.0 lb

## 2021-08-18 DIAGNOSIS — O99212 Obesity complicating pregnancy, second trimester: Secondary | ICD-10-CM

## 2021-08-18 DIAGNOSIS — O09529 Supervision of elderly multigravida, unspecified trimester: Secondary | ICD-10-CM

## 2021-08-18 DIAGNOSIS — O09522 Supervision of elderly multigravida, second trimester: Secondary | ICD-10-CM | POA: Diagnosis not present

## 2021-08-18 DIAGNOSIS — O99210 Obesity complicating pregnancy, unspecified trimester: Secondary | ICD-10-CM

## 2021-08-18 DIAGNOSIS — Z8659 Personal history of other mental and behavioral disorders: Secondary | ICD-10-CM
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
## 2021-08-31 ENCOUNTER — Ambulatory Visit: Payer: Self-pay | Admitting: Licensed Clinical Social Worker

## 2021-09-07 ENCOUNTER — Ambulatory Visit: Payer: Self-pay | Admitting: Licensed Clinical Social Worker
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
## 2021-09-15 ENCOUNTER — Other Ambulatory Visit: Payer: Self-pay

## 2021-09-15 ENCOUNTER — Ambulatory Visit: Payer: Self-pay | Admitting: Advanced Practice Midwife

## 2021-09-15 VITALS — BP 104/61 | HR 84 | Temp 98.1°F | Wt 153.6 lb

## 2021-09-15 DIAGNOSIS — O34219 Maternal care for unspecified type scar from previous cesarean delivery: Secondary | ICD-10-CM | POA: Insufficient documentation

## 2021-09-15 DIAGNOSIS — Z3689 Encounter for other specified antenatal screening: Secondary | ICD-10-CM

## 2021-09-15 DIAGNOSIS — Z23 Encounter for immunization: Secondary | ICD-10-CM

## 2021-09-15 DIAGNOSIS — Z9889 Other specified postprocedural states: Secondary | ICD-10-CM | POA: Insufficient documentation

## 2021-09-15 DIAGNOSIS — O09522 Supervision of elderly multigravida, second trimester: Secondary | ICD-10-CM

## 2021-09-15 DIAGNOSIS — Z8659 Personal history of other mental and behavioral disorders: Secondary | ICD-10-CM

## 2021-09-15 DIAGNOSIS — Z641 Problems related to multiparity: Secondary | ICD-10-CM | POA: Insufficient documentation

## 2021-09-15 DIAGNOSIS — O09529 Supervision of elderly multigravida, unspecified trimester: Secondary | ICD-10-CM | POA: Insufficient documentation

## 2021-09-15 HISTORY — DX: Encounter for other specified antenatal screening: Z36.89

## 2021-09-15 NOTE — Progress Notes (Signed)
Patient is here for MH RV at [redacted]w[redacted]d.   Patient declined QUAD screen and declination form signed and witnessed.   Flu vaccine administered and NCIR handed.   Patient is aware of [facility] anat [place] on 09/15/21 - appointment reminder given.   4 week follow-up scheduled for patient.   Floy Sabina, RN
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
## 2021-10-13 ENCOUNTER — Ambulatory Visit: Payer: Self-pay | Admitting: Advanced Practice Midwife

## 2021-10-13 ENCOUNTER — Other Ambulatory Visit: Payer: Self-pay

## 2021-10-13 VITALS — BP 91/51 | HR 83 | Temp 98.3°F | Wt 153.6 lb

## 2021-10-13 DIAGNOSIS — O99810 Abnormal glucose complicating pregnancy: Secondary | ICD-10-CM

## 2021-10-13 DIAGNOSIS — J329 Chronic sinusitis, unspecified: Secondary | ICD-10-CM | POA: Insufficient documentation

## 2021-10-13 DIAGNOSIS — Z641 Problems related to multiparity: Secondary | ICD-10-CM

## 2021-10-13 DIAGNOSIS — Z3689 Encounter for other specified antenatal screening: Secondary | ICD-10-CM

## 2021-10-13 DIAGNOSIS — O09529 Supervision of elderly multigravida, unspecified trimester: Secondary | ICD-10-CM

## 2021-10-13 DIAGNOSIS — O34219 Maternal care for unspecified type scar from previous cesarean delivery: Secondary | ICD-10-CM

## 2021-10-13 MED ORDER — PRENATAL VITAMIN 27-0.8 MG PO TABS: 1.0000 | ORAL_TABLET | Freq: Every day | ORAL | 0 refills | Status: DC
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
## 2021-10-13 NOTE — Progress Notes (Signed)
Client presents to clinic with mask intact. Jossie Ng, RN

## 2021-11-10 ENCOUNTER — Ambulatory Visit: Payer: Self-pay | Admitting: Advanced Practice Midwife

## 2021-11-10 ENCOUNTER — Ambulatory Visit: Payer: Self-pay

## 2021-11-10 ENCOUNTER — Other Ambulatory Visit: Payer: Self-pay

## 2021-11-10 VITALS — BP 93/52 | HR 79 | Temp 97.7°F | Wt 158.8 lb

## 2021-11-10 DIAGNOSIS — O24419 Gestational diabetes mellitus in pregnancy, unspecified control: Secondary | ICD-10-CM

## 2021-11-10 DIAGNOSIS — Z3689 Encounter for other specified antenatal screening: Secondary | ICD-10-CM

## 2021-11-10 DIAGNOSIS — O34219 Maternal care for unspecified type scar from previous cesarean delivery: Secondary | ICD-10-CM

## 2021-11-10 DIAGNOSIS — O09529 Supervision of elderly multigravida, unspecified trimester: Secondary | ICD-10-CM

## 2021-11-10 DIAGNOSIS — O99810 Abnormal glucose complicating pregnancy: Secondary | ICD-10-CM

## 2021-11-10 DIAGNOSIS — F329 Major depressive disorder, single episode, unspecified: Secondary | ICD-10-CM

## 2021-11-10 DIAGNOSIS — Z641 Problems related to multiparity: Secondary | ICD-10-CM

## 2021-11-10 NOTE — Progress Notes (Signed)
Patient here at 25w 2d for MH RV.   PHQ 9 filled (score = 4). High risk [facility] forms filled.   PTL handout given and explained to patient.   Floy Sabina, RN
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
## 2021-11-24 ENCOUNTER — Other Ambulatory Visit: Payer: Self-pay

## 2021-11-24 ENCOUNTER — Ambulatory Visit: Payer: Self-pay | Admitting: Advanced Practice Midwife

## 2021-11-24 VITALS — BP 104/53 | HR 84 | Temp 97.3°F | Wt 159.6 lb

## 2021-11-24 DIAGNOSIS — O24419 Gestational diabetes mellitus in pregnancy, unspecified control: Secondary | ICD-10-CM

## 2021-11-24 DIAGNOSIS — O09529 Supervision of elderly multigravida, unspecified trimester: Secondary | ICD-10-CM

## 2021-11-24 DIAGNOSIS — Z641 Problems related to multiparity: Secondary | ICD-10-CM

## 2021-11-24 DIAGNOSIS — O09523 Supervision of elderly multigravida, third trimester: Secondary | ICD-10-CM

## 2021-11-24 DIAGNOSIS — Z3689 Encounter for other specified antenatal screening: Secondary | ICD-10-CM

## 2021-11-24 DIAGNOSIS — Z23 Encounter for immunization: Secondary | ICD-10-CM

## 2021-11-24 DIAGNOSIS — O99810 Abnormal glucose complicating pregnancy: Secondary | ICD-10-CM

## 2021-11-24 DIAGNOSIS — O34219 Maternal care for unspecified type scar from previous cesarean delivery: Secondary | ICD-10-CM

## 2021-11-24 LAB — HEMOGLOBIN, FINGERSTICK: Hemoglobin: 11.5 g/dL (ref 11.1–15.9)
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
## 2021-11-24 NOTE — Progress Notes (Signed)
Here today for 27.3 week MH RV. Taking PNV and ASA QD. 28 week labs today with the exception of 3hgtt which has been scheduled for 11/28/2021 @ 8:20. Tdap today. Tawny Hopping, RN

## 2021-11-24 NOTE — Progress Notes (Signed)
Hgb reviewed, no treatment indicated..Lemoyne Scarpati Brewer-Jensen, RN  

## 2021-11-25 LAB — RPR: RPR Ser Ql: NONREACTIVE

## 2021-11-25 LAB — HIV-1/HIV-2 QUALITATIVE RNA
HIV-1 RNA, Qualitative: NONREACTIVE
HIV-2 RNA, Qualitative: NONREACTIVE

## 2021-11-28 ENCOUNTER — Other Ambulatory Visit: Payer: Self-pay

## 2021-11-28 DIAGNOSIS — O99810 Abnormal glucose complicating pregnancy: Secondary | ICD-10-CM

## 2021-11-28 NOTE — Progress Notes (Signed)
In Nurse Clinic for 3 hr GTT. Reports npo since last night at 7 pm. Instructions given for test today. Reports understanding. Advised to notify RN if nausea/vomiting. Jerel Shepherd, RN

## 2021-11-29 LAB — GLUCOSE TOLERANCE TEST, 6 HOUR
Glucose, 1 Hour GTT: 174 mg/dL (ref 70–199)
Glucose, 2 hour: 150 mg/dL — ABNORMAL HIGH (ref 70–139)
Glucose, 3 hour: 114 mg/dL — ABNORMAL HIGH (ref 70–109)
Glucose, GTT - Fasting: 75 mg/dL (ref 70–99)

## 2021-12-08 ENCOUNTER — Ambulatory Visit: Payer: Self-pay | Admitting: Advanced Practice Midwife

## 2021-12-08 ENCOUNTER — Other Ambulatory Visit: Payer: Self-pay

## 2021-12-08 VITALS — BP 97/53 | HR 102 | Temp 97.0°F | Wt 163.4 lb

## 2021-12-08 DIAGNOSIS — O34219 Maternal care for unspecified type scar from previous cesarean delivery: Secondary | ICD-10-CM

## 2021-12-08 DIAGNOSIS — Z641 Problems related to multiparity: Secondary | ICD-10-CM

## 2021-12-08 DIAGNOSIS — Z8632 Personal history of gestational diabetes: Secondary | ICD-10-CM

## 2021-12-08 DIAGNOSIS — Z3689 Encounter for other specified antenatal screening: Secondary | ICD-10-CM

## 2021-12-08 DIAGNOSIS — O09523 Supervision of elderly multigravida, third trimester: Secondary | ICD-10-CM

## 2021-12-08 DIAGNOSIS — O09529 Supervision of elderly multigravida, unspecified trimester: Secondary | ICD-10-CM
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
## 2021-12-22 ENCOUNTER — Ambulatory Visit: Payer: Self-pay

## 2021-12-29 ENCOUNTER — Other Ambulatory Visit: Payer: Self-pay

## 2021-12-29 ENCOUNTER — Ambulatory Visit: Payer: Self-pay | Admitting: Advanced Practice Midwife

## 2021-12-29 VITALS — BP 101/62 | HR 89 | Temp 97.7°F | Wt 166.0 lb

## 2021-12-29 DIAGNOSIS — O09529 Supervision of elderly multigravida, unspecified trimester: Secondary | ICD-10-CM

## 2021-12-29 DIAGNOSIS — Z641 Problems related to multiparity: Secondary | ICD-10-CM

## 2021-12-29 DIAGNOSIS — O34219 Maternal care for unspecified type scar from previous cesarean delivery: Secondary | ICD-10-CM

## 2021-12-29 DIAGNOSIS — Z8659 Personal history of other mental and behavioral disorders: Secondary | ICD-10-CM

## 2021-12-29 DIAGNOSIS — Z3689 Encounter for other specified antenatal screening: Secondary | ICD-10-CM

## 2021-12-29 DIAGNOSIS — O09523 Supervision of elderly multigravida, third trimester: Secondary | ICD-10-CM

## 2021-12-29 NOTE — Progress Notes (Signed)
Patient here for MH RV at 7 2/7. Kick counts reviewed with client and cards given.Jenetta Downer, RN  ?
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
## 2022-01-12 ENCOUNTER — Ambulatory Visit: Payer: Self-pay | Admitting: Nurse Practitioner

## 2022-01-12 ENCOUNTER — Other Ambulatory Visit: Payer: Self-pay

## 2022-01-12 ENCOUNTER — Encounter: Payer: Self-pay | Admitting: Nurse Practitioner

## 2022-01-12 VITALS — BP 101/56 | HR 91 | Temp 97.3°F | Wt 169.6 lb

## 2022-01-12 DIAGNOSIS — O09529 Supervision of elderly multigravida, unspecified trimester: Secondary | ICD-10-CM

## 2022-01-12 DIAGNOSIS — O09523 Supervision of elderly multigravida, third trimester: Secondary | ICD-10-CM

## 2022-01-12 DIAGNOSIS — Z8659 Personal history of other mental and behavioral disorders: Secondary | ICD-10-CM

## 2022-01-12 LAB — URINALYSIS
Bilirubin, UA: NEGATIVE
Glucose, UA: NEGATIVE
Ketones, UA: NEGATIVE
Nitrite, UA: POSITIVE — AB
Specific Gravity, UA: 1.015 (ref 1.005–1.030)
Urobilinogen, Ur: 0.2 mg/dL (ref 0.2–1.0)
pH, UA: 7 (ref 5.0–7.5)

## 2022-01-12 NOTE — Progress Notes (Signed)
Urine dip reviewed with provider. Urine culture added to orders.Burt Knack, RN  ?
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
## 2022-01-12 NOTE — Progress Notes (Signed)
Patient here for MH RV at 64 2/7. Given more kick count cards and counseled that she only needs to check kick counts once daily. [facility] contact card given (patient thinks she has one at home, but not sure).Jenetta Downer, RN  ?

## 2022-01-16 ENCOUNTER — Telehealth: Payer: Self-pay | Admitting: Family Medicine

## 2022-01-16 LAB — URINE CULTURE

## 2022-01-16 NOTE — Telephone Encounter (Signed)
Call to client who desires to know results from urine culture collected 01/12/2022. Counseled that testing was still in process and would receive a call if treatment needed. Client also counseled could call back at any time to check if result available. Jossie Ng, RN ? ?

## 2022-01-16 NOTE — Telephone Encounter (Signed)
Pt states that she had lab testing here on Friday 01/12/22. Pt states that She can see her results are back but she doesn't understand them. Pt states that she is feeling worse than she did on Friday. Pt wants to speak with a nurse.  ?

## 2022-01-18 ENCOUNTER — Telehealth: Payer: Self-pay

## 2022-01-18 NOTE — Telephone Encounter (Signed)
TC to patient to inform of UTI and need for treatment appointment. Patient scheduled for 01/19/22 to come to clinic for treatment. Patient aware of next regular MH RV on 01/26/22.Marland KitchenBurt Knack, RN  ?

## 2022-01-19 ENCOUNTER — Other Ambulatory Visit: Payer: Self-pay

## 2022-01-19 ENCOUNTER — Ambulatory Visit: Payer: Self-pay | Admitting: Family Medicine

## 2022-01-19 VITALS — BP 100/60 | HR 95 | Temp 97.5°F | Wt 171.0 lb

## 2022-01-19 DIAGNOSIS — O2343 Unspecified infection of urinary tract in pregnancy, third trimester: Secondary | ICD-10-CM

## 2022-01-19 MED ORDER — NITROFURANTOIN MONOHYD MACRO 100 MG PO CAPS: 100.0000 mg | ORAL_CAPSULE | Freq: Two times a day (BID) | ORAL | 0 refills | Status: DC

## 2022-01-19 NOTE — Progress Notes (Signed)
S: Pt in clinic for treatment for UTI.  Pt denies any problems or concerns  ? ?O: Urine culture  + for Klebsiella pnuemoniae  ? ?A/P:  1. UTI (urinary tract infection) during pregnancy, third trimester ? ?- nitrofurantoin, macrocrystal-monohydrate, (MACROBID) 100 MG capsule; Take 1 capsule (100 mg total) by mouth 2 (two) times daily.  Dispense: 14 capsule; Refill: 0 ? ?-discussed instructions for taking medications ?- need for TOC at completion of Antibiotics  ?- pt has no other questions or concerns at this time.   ?-FHT -145  ?- next appointment 01/26/22  ? ?Wendi Snipes, FNP ? ?

## 2022-01-19 NOTE — Progress Notes (Signed)
Patient here for UTI treatment in pregnancy. Patient has [facility] contact card. Aware of next regular MH RV on 01/26/22.Marland KitchenBurt Knack, RN  ?

## 2022-01-26 ENCOUNTER — Ambulatory Visit: Payer: Self-pay | Admitting: Advanced Practice Midwife

## 2022-01-26 VITALS — BP 113/64 | HR 104 | Temp 97.0°F | Wt 172.2 lb

## 2022-01-26 DIAGNOSIS — Z9889 Other specified postprocedural states: Secondary | ICD-10-CM

## 2022-01-26 DIAGNOSIS — O09529 Supervision of elderly multigravida, unspecified trimester: Secondary | ICD-10-CM

## 2022-01-26 DIAGNOSIS — F329 Major depressive disorder, single episode, unspecified: Secondary | ICD-10-CM

## 2022-01-26 DIAGNOSIS — O34219 Maternal care for unspecified type scar from previous cesarean delivery: Secondary | ICD-10-CM

## 2022-01-26 DIAGNOSIS — O99810 Abnormal glucose complicating pregnancy: Secondary | ICD-10-CM

## 2022-01-26 DIAGNOSIS — Z3689 Encounter for other specified antenatal screening: Secondary | ICD-10-CM

## 2022-01-26 DIAGNOSIS — Z641 Problems related to multiparity: Secondary | ICD-10-CM

## 2022-01-26 DIAGNOSIS — O2340 Unspecified infection of urinary tract in pregnancy, unspecified trimester: Secondary | ICD-10-CM | POA: Insufficient documentation

## 2022-01-26 LAB — WET PREP FOR TRICH, YEAST, CLUE
Trichomonas Exam: NEGATIVE
Yeast Exam: NEGATIVE
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
## 2022-01-26 NOTE — Progress Notes (Addendum)
Patient here for MH RV at 36 2/7. 36 week cultures and packet given. Patient declines self-collection. Finished UTI medication today. Needs TOC. Patient states she has a Recruitment consultant card.Marland KitchenMarland KitchenBurt Knack, RN  ?

## 2022-01-26 NOTE — Progress Notes (Signed)
Wet prep negative, no treatment indicated, provider aware. Burt Knack, RN  ?

## 2022-01-28 LAB — CHLAMYDIA/GC NAA, CONFIRMATION
Chlamydia trachomatis, NAA: NEGATIVE
Neisseria gonorrhoeae, NAA: NEGATIVE

## 2022-01-29 LAB — URINE CULTURE

## 2022-01-30 ENCOUNTER — Encounter: Payer: Self-pay | Admitting: Advanced Practice Midwife

## 2022-01-30 DIAGNOSIS — B951 Streptococcus, group B, as the cause of diseases classified elsewhere: Secondary | ICD-10-CM | POA: Insufficient documentation

## 2022-01-30 LAB — CULTURE, BETA STREP (GROUP B ONLY): Strep Gp B Culture: POSITIVE — AB

## 2022-02-01 ENCOUNTER — Ambulatory Visit: Payer: Self-pay | Admitting: Nurse Practitioner

## 2022-02-01 VITALS — BP 89/59 | HR 83 | Temp 97.2°F | Wt 174.4 lb

## 2022-02-01 DIAGNOSIS — O09529 Supervision of elderly multigravida, unspecified trimester: Secondary | ICD-10-CM

## 2022-02-01 DIAGNOSIS — O09523 Supervision of elderly multigravida, third trimester: Secondary | ICD-10-CM

## 2022-02-01 DIAGNOSIS — Z8659 Personal history of other mental and behavioral disorders: Secondary | ICD-10-CM

## 2022-02-01 MED ORDER — PRENATAL VITAMIN 27-0.8 MG PO TABS: 1.0000 | ORAL_TABLET | Freq: Every day | ORAL | 0 refills | Status: DC

## 2022-02-01 NOTE — Progress Notes (Signed)
Patient here for MH RV at 37 1/7. GBS counseling done today and literature given. Patient aware of urology follow-up on 07/17/2022.Marland KitchenMarland KitchenBurt Knack, RN  ?

## 2022-02-02 ENCOUNTER — Encounter: Payer: Self-pay | Admitting: Nurse Practitioner
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
## 2022-02-08 ENCOUNTER — Ambulatory Visit: Payer: Self-pay | Admitting: Advanced Practice Midwife

## 2022-02-08 VITALS — BP 105/65 | HR 86 | Temp 97.6°F | Wt 175.2 lb

## 2022-02-08 DIAGNOSIS — O09523 Supervision of elderly multigravida, third trimester: Secondary | ICD-10-CM

## 2022-02-08 DIAGNOSIS — O09529 Supervision of elderly multigravida, unspecified trimester: Secondary | ICD-10-CM

## 2022-02-08 DIAGNOSIS — Z641 Problems related to multiparity: Secondary | ICD-10-CM

## 2022-02-08 DIAGNOSIS — O99810 Abnormal glucose complicating pregnancy: Secondary | ICD-10-CM

## 2022-02-08 DIAGNOSIS — O34219 Maternal care for unspecified type scar from previous cesarean delivery: Secondary | ICD-10-CM

## 2022-02-08 DIAGNOSIS — Z3689 Encounter for other specified antenatal screening: Secondary | ICD-10-CM
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
## 2022-02-14 ENCOUNTER — Ambulatory Visit: Payer: Self-pay | Admitting: Advanced Practice Midwife

## 2022-02-14 ENCOUNTER — Telehealth: Payer: Self-pay

## 2022-02-14 DIAGNOSIS — O09523 Supervision of elderly multigravida, third trimester: Secondary | ICD-10-CM

## 2022-02-14 DIAGNOSIS — Z641 Problems related to multiparity: Secondary | ICD-10-CM

## 2022-02-14 DIAGNOSIS — O99810 Abnormal glucose complicating pregnancy: Secondary | ICD-10-CM

## 2022-02-14 DIAGNOSIS — Z3689 Encounter for other specified antenatal screening: Secondary | ICD-10-CM

## 2022-02-14 DIAGNOSIS — O09529 Supervision of elderly multigravida, unspecified trimester: Secondary | ICD-10-CM

## 2022-02-14 DIAGNOSIS — O34219 Maternal care for unspecified type scar from previous cesarean delivery: Secondary | ICD-10-CM

## 2022-02-14 NOTE — Progress Notes (Signed)
C/o right side back pain, radiates to front abdomen, rated 3 on scale 0-10 last evening that is making it difficult to sleep.    ?

## 2022-02-14 NOTE — Progress Notes (Signed)
[facility] delivery plans. Pending U/S at [facility] 04/24. Patient aware. ?

## 2022-02-14 NOTE — Telephone Encounter (Signed)
Call to [place], [place] scheduler to request IOL appt 02/23/2022 at 40 2/7. Patrice requested indication for IOL that EGA and provided following info: hx of 6 macrosomic births, pp hemorrhage x49 and 41 years of age. Per Sharl Ma, as no current dx of macrosomia, can not schedule IOL at 40 2/7. Per Sharl Ma, if 02/19/2022 [facility] [place] results in diagnosis of macrosomia, call can be made to move up IOL appt (added to pink sticky). IOL scheduled for 03/03/2022 and client will receive call between 11000 - 1400 from [facility] with arrival time. Ola Spurr CNM notified of above and requested Patrice's number to call regarding IOL appt. Ms. Chesley Mires CNM to notify RN if change if IOL appt so client can be notified. Rich Number, RN ? ?

## 2022-02-14 NOTE — Telephone Encounter (Signed)
Per Hazle Coca CNM, she discussed scheduled IOL appt of 03/03/2022 with Dr. Barbette Merino at [facility]. Client has now been added to elective IOL list and will receive a call when appt available. Call to client and counseled regarding above. Client counseled to expect a call from [facility] at anytime to go for her induction and encouraged her to have labor bag, etc ready ASAP. Jossie Ng, RN ? ?
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
## 2022-02-15 ENCOUNTER — Ambulatory Visit: Payer: Self-pay

## 2022-02-21 ENCOUNTER — Ambulatory Visit: Payer: Self-pay

## 2022-04-06 ENCOUNTER — Ambulatory Visit: Payer: Self-pay | Admitting: Nurse Practitioner

## 2022-04-06 ENCOUNTER — Ambulatory Visit: Payer: Self-pay

## 2022-04-06 ENCOUNTER — Encounter: Payer: Self-pay | Admitting: Nurse Practitioner

## 2022-04-06 DIAGNOSIS — Z01419 Encounter for gynecological examination (general) (routine) without abnormal findings: Secondary | ICD-10-CM

## 2022-04-06 LAB — HEMOGLOBIN, FINGERSTICK: Hemoglobin: 12.4 g/dL (ref 11.1–15.9)

## 2022-04-06 NOTE — Progress Notes (Signed)
Desires IUD placement PP. Does not wish to have HIV testing today. Provider reviewed Hgb. Paragard Consents signed however attempts to place unsuccessful. Maxi pads given to patient.  Family planing Educational materials given but patient left on counter after visit. Provider aware of hgb prior to discharge. Delynn Flavin RN

## 2022-04-06 NOTE — Progress Notes (Unsigned)
[facility] Department  Postpartum Exam  Julie Austin is a 41 y.o. [id number] female who presents for a postpartum visit. She is 7 weeks postpartum following a normal spontaneous vaginal delivery.  I have fully reviewed the prenatal and intrapartum course. The delivery was at 39 gestational weeks.  Anesthesia: epidural. Postpartum course has been good, reports a little dizziness and energy low. Baby is doing well. Baby is feeding by bottle/Gerber. Bleeding no bleeding. Bowel function is normal. Bladder function is normal. Patient is not sexually active. Contraception method is IUD. Postpartum depression screening: negative.   The pregnancy intention screening data noted above was reviewed. Potential methods of contraception were discussed. The patient elected to proceed with No Method - Other Reason.    Health Maintenance Due  Topic Date Due   TETANUS/TDAP  Never done   COVID-19 Vaccine (3 - Pfizer series) 04/01/2020    The following portions of the patient's history were reviewed and updated as appropriate: allergies, current medications, past family history, past medical history, past social history, past surgical history, and problem list.  Review of Systems Pertinent items noted in HPI and remainder of comprehensive ROS otherwise negative.  Patient positive for dizziness and tiredness.   Objective:  BP (!) 114/55   Pulse 78   Temp (!) 97.5 F (36.4 C)   Wt 159 lb 12.8 oz (72.5 kg)   LMP 03/16/2022 (Approximate) Comment: 6 days  Breastfeeding No   BMI 29.23 kg/m    General:  alert and cooperative   Breasts:  normal  Lungs: clear to auscultation bilaterally  Heart:  regular rate and rhythm, S1, S2 normal, no murmur, click, rub or gallop  Abdomen: soft, non-tender; bowel sounds normal; no masses,  no organomegaly   Wound none  GU exam:  normal       Assessment:    1. Postpartum hemorrhage, unspecified type -Patient denies bleeding.   2. Well woman exam  with routine gynecological exam -Normal well woman exam. -CBE today.  Next due 03/2023 -PAP due 12/28/23  3. Postpartum exam -41 year old female in clinic today for postpartum visit. -Patient with complaints of dizziness, headaches, and tiredness.  Encouraged patient to eat frequent small meals high in protein and drink at least 6-8 bottles of water daily.  Also encouraged patient to get plenty of rest especially when the baby is resting.   -Hgb- 12.4 -PHQ-9=1 -Patient desires Paragard today.  Multiple attempts made for insertion.  Insertion unsuccessful today.  Patient to return to clinic on 04/10/22.     Plan:   Essential components of care per ACOG recommendations:  1.  Mood and well being: Patient with negative depression screening today. Reviewed local resources for support.  - Patient tobacco use? No.   - hx of drug use? No.    2. Infant care and feeding:  -Patient currently breastmilk feeding? No.  -Social determinants of health (SDOH) reviewed in EPIC. No concerns.    3. Sexuality, contraception and birth spacing - Patient does not want a pregnancy in the next year.  Desired family size is 8 children.  - Reviewed reproductive life planning. Reviewed options based on patient desire and reproductive life plan. Patient is interested in IUD or IUS. This was provided to the patient today.   Risks, benefits, and typical effectiveness rates were reviewed.  Questions were answered.  Written information was also given to the patient to review.    The patient will follow up in  1 years for  surveillance.  The patient was told to call with any further questions, or with any concerns about this method of contraception.  Emphasized use of condoms 100% of the time for STI prevention.  -ECP not offered due to patient not being sexually active.    - Discussed birth spacing of 18 months  4. Sleep and fatigue -Encouraged family/partner/community support of 4 hrs of uninterrupted sleep to help  with mood and fatigue  5. Physical Recovery  - Discussed patients delivery and complications. She describes her labor as good and long.  - Patient had a Vaginal, no problems at delivery. Patient had no laceration. Perineal healing reviewed. Patient expressed understanding - Patient has urinary incontinence? No. - Patient is safe to resume physical and sexual activity  6.  Health Maintenance - HM due items addressed No - no issues noted  - Last pap smear was 12/27/2020  -Breast Cancer screening indicated? No.   7. Chronic Disease/Pregnancy Condition follow up: None  - PCP follow up  Glenna Fellows, FNP

## 2022-04-10 ENCOUNTER — Ambulatory Visit (LOCAL_COMMUNITY_HEALTH_CENTER): Payer: Self-pay | Admitting: Family Medicine

## 2022-04-10 DIAGNOSIS — Z30430 Encounter for insertion of intrauterine contraceptive device: Secondary | ICD-10-CM

## 2022-04-10 DIAGNOSIS — Z3009 Encounter for other general counseling and advice on contraception: Secondary | ICD-10-CM

## 2022-04-10 MED ORDER — PARAGARD INTRAUTERINE COPPER IU IUD
1.0000 | INTRAUTERINE_SYSTEM | Freq: Once | INTRAUTERINE | Status: AC
  Administered 2022-04-10: 1 via INTRAUTERINE

## 2022-04-10 NOTE — Progress Notes (Signed)
    Patient presented to [facility] for IUD insertion. Her GC/CT screening was found to be up to date and using WHO criteria we can be reasonably certain she is not pregnant.  See Flowsheet for IUD check list  IUD Insertion Procedure Note Patient identified, informed consent performed, consent signed.   Discussed risks of irregular bleeding, cramping, infection, malpositioning or misplacement of the IUD outside the uterus which may require further procedure such as laparoscopy. Time out was performed.    Speculum placed in the vagina.  Cervix visualized.  Cleaned with Betadine x 2.  Grasped anteriorly with a single tooth tenaculum.  Uterus sounded to 9 cm.  IUD placed per manufacturer's recommendations.  Strings trimmed to 3 cm.  Tenaculum was removed, good hemostasis noted.  Patient tolerated procedure well.   Patient was given post-procedure instructions- both agency handout and verbally by provider.  She was advised to have backup contraception for one week.  Patient was also asked to check IUD strings periodically or follow up in 4 weeks for IUD check. 

## 2022-04-10 NOTE — Progress Notes (Signed)
Pt here for IUD insertion of Paragard. Pt signed consents on 04/06/22. Paragard inserted per C [place] without complication. Pt verbalizes understanding of IUD counseling. Paragard card & booklet given.  Lethea Killings RN

## 2022-07-17 ENCOUNTER — Ambulatory Visit (INDEPENDENT_AMBULATORY_CARE_PROVIDER_SITE_OTHER): Payer: Self-pay | Admitting: Urology

## 2022-07-17 ENCOUNTER — Ambulatory Visit
Admission: RE | Admit: 2022-07-17 | Discharge: 2022-07-17 | Disposition: A | Discharge status: Home / Self Care | Payer: Self-pay | Source: Ambulatory Visit | Attending: Urology | Admitting: Urology

## 2022-07-17 ENCOUNTER — Ambulatory Visit
Admission: RE | Admit: 2022-07-17 | Discharge: 2022-07-17 | Disposition: A | Discharge status: Home / Self Care | Payer: Self-pay | Attending: Urology | Admitting: Urology

## 2022-07-17 VITALS — BP 102/62 | HR 65 | Wt 151.2 lb

## 2022-07-17 DIAGNOSIS — N200 Calculus of kidney: Secondary | ICD-10-CM

## 2022-07-17 DIAGNOSIS — R82994 Hypercalciuria: Secondary | ICD-10-CM

## 2022-07-17 DIAGNOSIS — Z87442 Personal history of urinary calculi: Secondary | ICD-10-CM

## 2022-07-17 NOTE — Progress Notes (Signed)
07/17/2022 9:13 AM   Julie Austin 05-13-1981 [id number]  Referring provider: Center, [facility] [address] [place],  [place] [address]  Chief Complaint  Patient presents with   Follow-up    Follow up with kub     HPI: 41 year old female with a personal history of left staghorn calculus who presents today for routine annual follow-up.  Over the last year, she has felt quite well.  She does not have any flank pain or possibly kidney stones.  She was treated in her third trimester for a Klebsiella UTI.  Otherwise, no GI GU issues.  No hematuria or dysuria.  Please see previous notes for details.  She had a left PCNL for a nearly 3 cm partial left staghorn in 02/2021.  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  In the setting of her recently identified pregnancy, she elected not to proceed with pharmacotherapy management for stone prevention.  Please see previous notes for details.    PMH: Past Medical History:  Diagnosis Date   Anemia    blood transfusion 4th delivery; states last 2 deliveries lost blood and rec'd IV supplement after delivery   Blood transfusion without reported diagnosis 03/10/2003   reports shoulder dystocia and had blood transfusion after delivery (pregnancy #4)   Depression    states PP depression with pregnancy #6 but no meds   Gestational diabetes    states with pregnancy #6   History of urinary tract infection    Kidney stones 12/2020   states problem with kidney stones began 12/2020 and was told body produces a lot of calcium   Vaginal itching    did not mention this during nurse interview prior to new OB appt   Vaginal Pap smear, abnormal    leep procedure at [facility]    Surgical History: Past Surgical History:  Procedure Laterality Date   IR CONVERT LEFT NEPHROSTOMY TO NEPHROURETERAL CATH  03/06/2021   IR NEPHROSTOMY PLACEMENT LEFT  02/26/2021   LEEP     [facility] (thinks this was procedure as done under general anesthesia  due to an abnormal PAP)   NEPHROLITHOTOMY Left 03/06/2021   Procedure: NEPHROLITHOTOMY PERCUTANEOUS;  Surgeon: [name], MD;  Location: [facility] ORS;  Service: Urology;  Laterality: Left;    Home Medications:  Allergies as of 07/17/2022   No Known Allergies      Medication List        Accurate as of July 17, 2022  9:13 AM. If you have any questions, ask your nurse or doctor.          Prenatal Vitamin 27-0.8 MG Tabs Take 1 tablet by mouth daily.        Allergies: No Known Allergies  Family History: Family History  Problem Relation Age of Onset   Migraines Mother    Diabetes Father    Healthy Sister    Healthy Sister    Healthy Sister    Healthy Sister    Healthy Sister    Healthy Daughter    Healthy Son    Healthy Son    Healthy Son    Polycystic kidney disease Son    Healthy Son    Healthy Son    Diabetes Maternal Grandmother    Heart disease Maternal Grandmother    Diabetes Maternal Grandfather    Diabetes Paternal Grandmother    Diabetes Paternal Grandfather     Social History:  reports that she has never smoked. She has been exposed to tobacco smoke. She has never  used smokeless tobacco. She reports that she does not currently use alcohol after a past usage of about 2.0 standard drinks of alcohol per week. She reports that she does not use drugs.   Physical Exam: BP 102/62   Pulse 65   Wt 151 lb 3.2 oz (68.6 kg)   BMI 27.65 kg/m   Constitutional:  Alert and oriented, No acute distress. HEENT: [place] AT, moist mucus membranes.  Trachea midline, no masses. Cardiovascular: No clubbing, cyanosis, or edema. Neurologic: Grossly intact, no focal deficits, moving all 4 extremities. Psychiatric: Normal mood and affect.  Laboratory Data: Lab Results  Component Value Date   WBC 6.0 07/21/2021   HGB 11.8 07/21/2021   HCT 36.7 07/21/2021   MCV 82 07/21/2021   PLT 312 07/21/2021    Lab Results  Component Value Date   CREATININE 0.83 03/07/2021      Lab Results  Component Value Date   HGBA1C 5.8 (H) 07/21/2021    Pertinent Imaging: KUB today was personally reviewed by me, awaiting radiologic interpretation and compared to previous imaging studies.  She has a 4 mm right lower pole stone which appears unchanged.  Debris in her left kidney is no longer visible but may be obscured, as seen on previous postop KUB.  No significant overall stone burden.  Assessment & Plan:    1. 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  She agrees with this plan.  Follow-up: 1 year with KUB   Hollice Espy, MD  [facility] [address] [place], [place] [address] [phone]

## 2022-12-03 ENCOUNTER — Ambulatory Visit: Payer: Self-pay

## 2023-02-06 IMAGING — XA IR NEPHROSTOMY PLACEMENT LEFT
7 of 8 series · 15 of 24 positions shown · non-contrast
Comparison: CT 02/26/2019

INDICATION: 40-year-old with obstructive left renal calculi. Left hydronephrosis
with intermittent fevers.

EXAM:
PLACEMENT OF LEFT PERCUTANEOUS NEPHROSTOMY TUBE

[Series 1: ir (id) (id)/(id)/(id) ir · 3 of 5 slices shown]
[im 1/5]
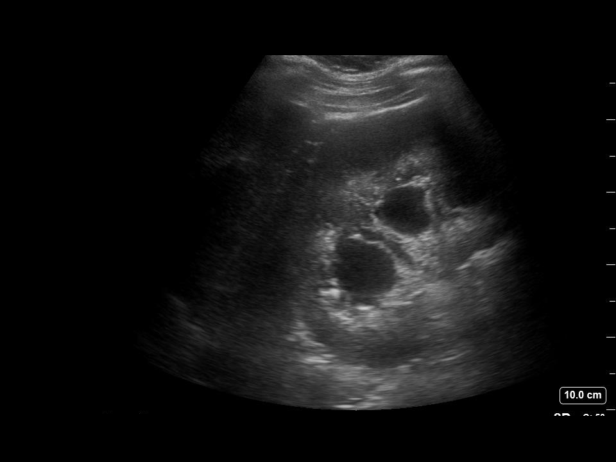
[im 3/5]
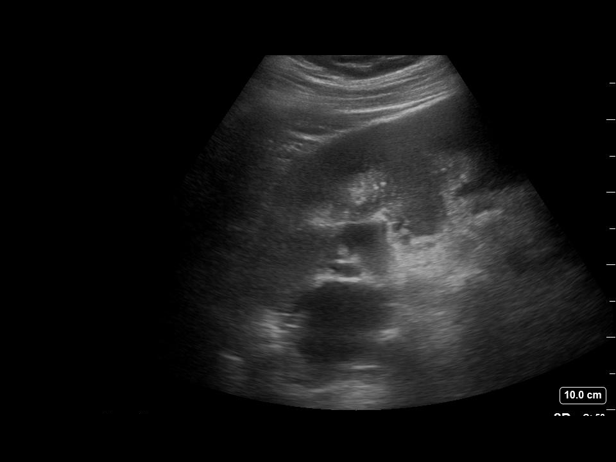
[im 5/5]
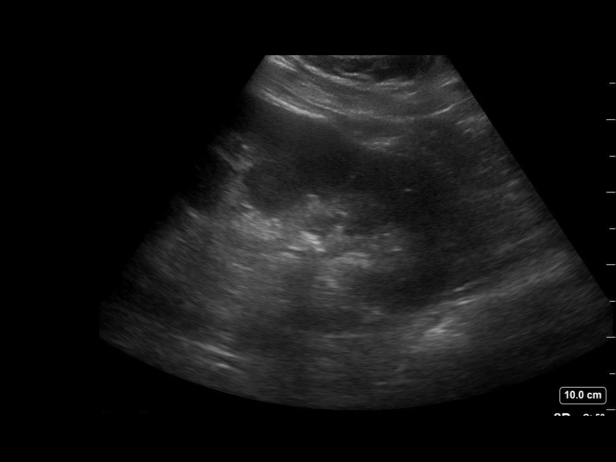

[Series 1: fluoroscopy - stored · 3 of 121 frames shown (1 of 6)]
[frame 1/121]
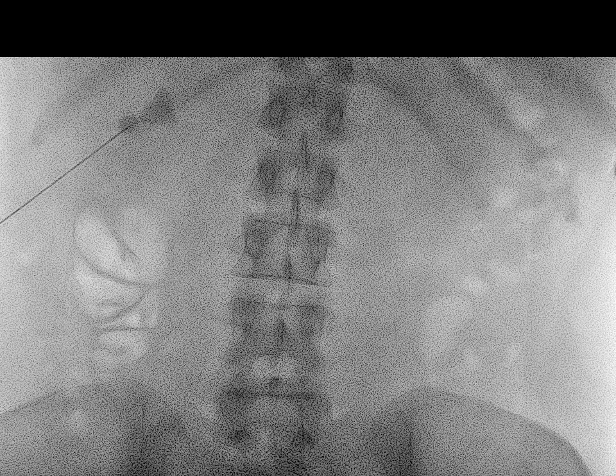
[frame 61/121]
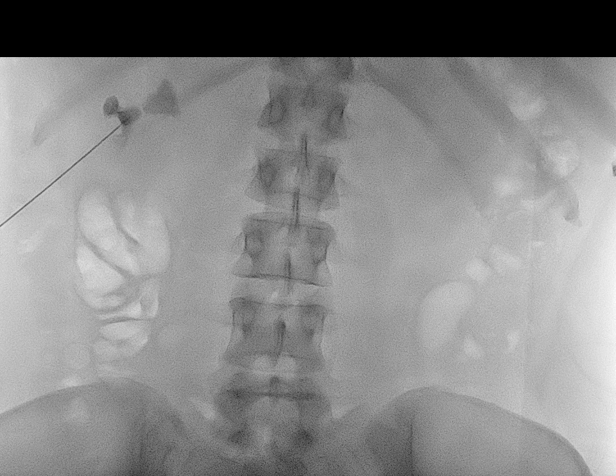
[frame 103/121]
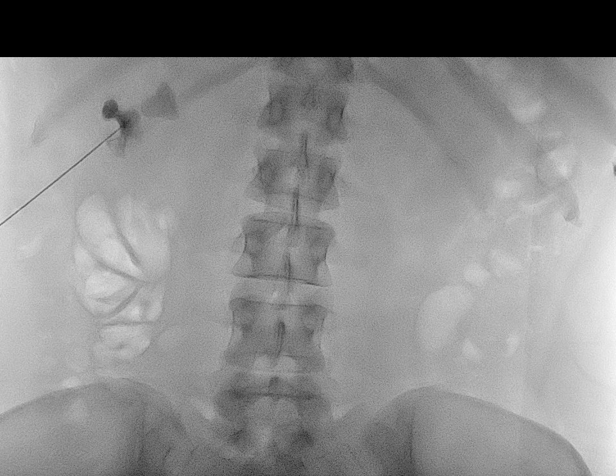

[Series 2: fluoroscopy - stored · 3 of 138 frames shown (2 of 6)]
[frame 1/138]
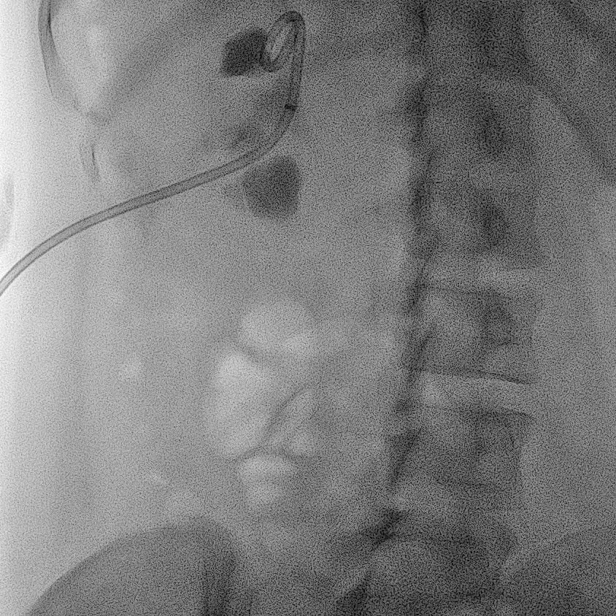
[frame 70/138]
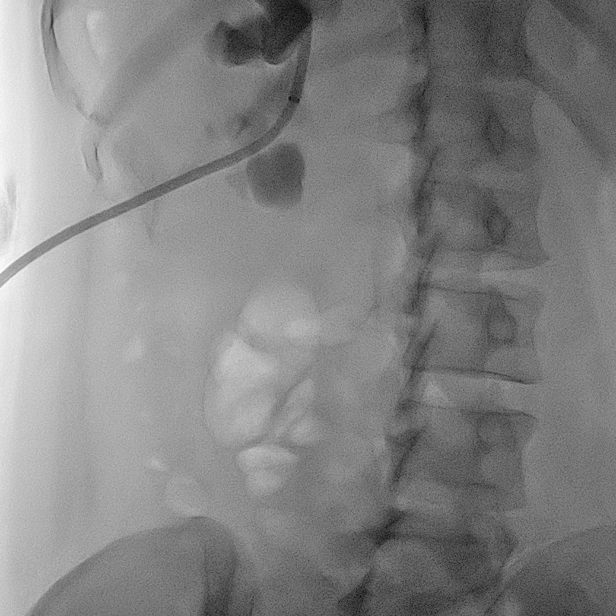
[frame 118/138]
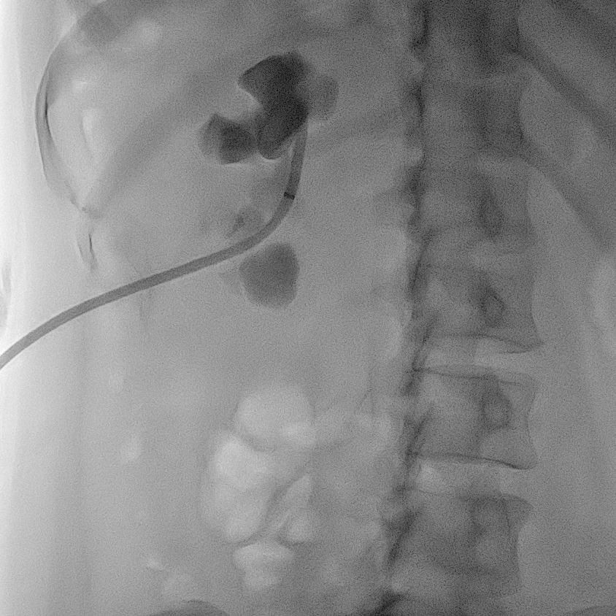

[Series 3: fluoroscopy - stored · 2 of 86 frames shown (3 of 6)]
[frame 13/86]
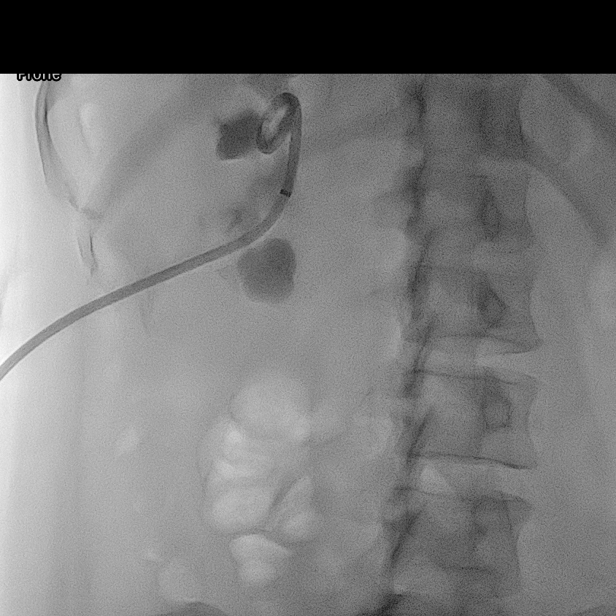
[frame 44/86]
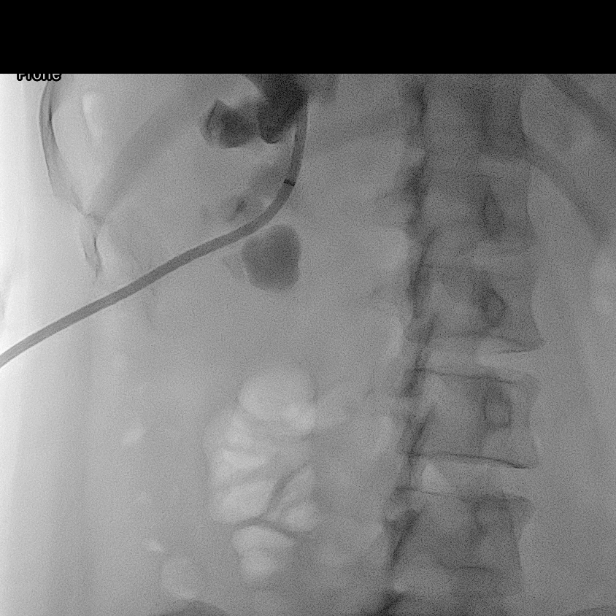

[Series 3: fluoroscopy - stored · 1 of 1 slices shown (4 of 6)]
[im 1/1]
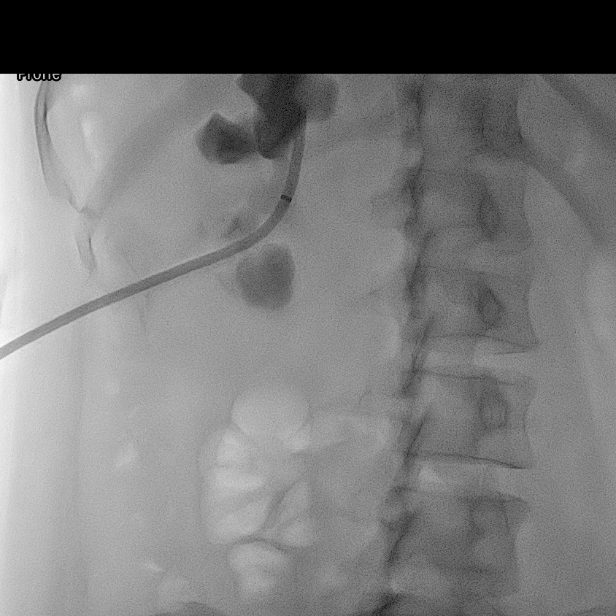

[Series 4: fluoroscopy - stored · 2 of 55 frames shown (5 of 6)]
[frame 9/55]
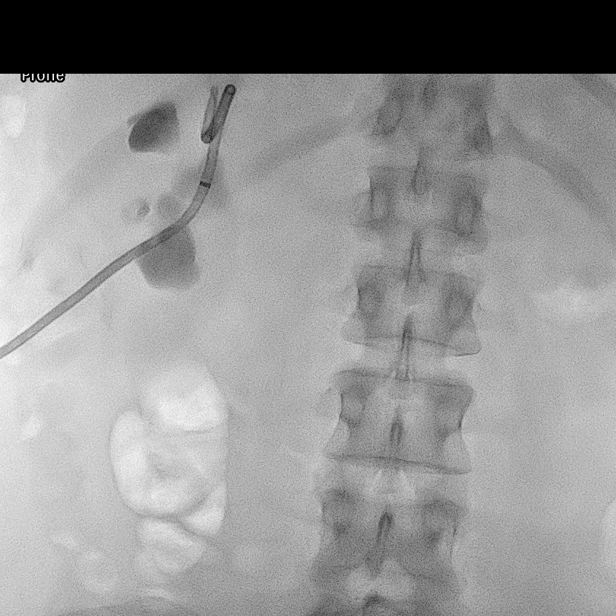
[frame 28/55]
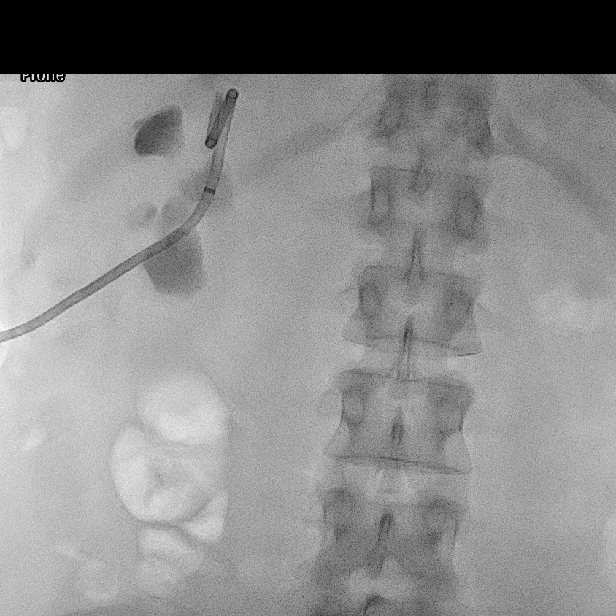

[Series 4: fluoroscopy - stored · 1 of 1 slices shown (6 of 6)]
[im 1/1]
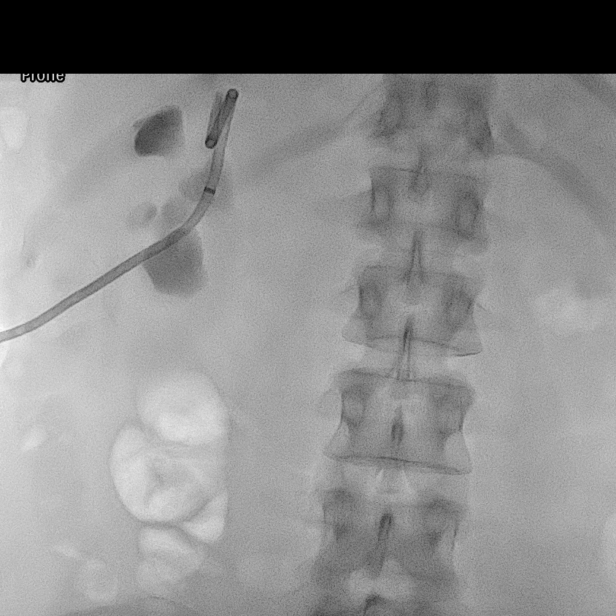

[15 of 24 positions shown; findings below may reference images not displayed]

MEDICATIONS:
Ancef 2 g; The antibiotic was administered in an appropriate time
frame prior to skin puncture.

ANESTHESIA/SEDATION:
Fentanyl 25 mcg IV; Versed 1.0 mg IV

Moderate Sedation Time:  20 minutes

The patient was continuously monitored during the procedure by the
interventional radiology nurse under my direct supervision.

CONTRAST:  15 mL-administered into the collecting system(s)

FLUOROSCOPY TIME:  Fluoroscopy Time: 2 minutes, 54 seconds, 12.8 mGy

COMPLICATIONS:
None immediate.

PROCEDURE:
Informed written consent was obtained from the patient after a
thorough discussion of the procedural risks, benefits and
alternatives. All questions were addressed. Maximal Sterile Barrier
Technique was utilized including caps, mask, sterile gowns, sterile
gloves, sterile drape, hand hygiene and skin antiseptic. A timeout
was performed prior to the initiation of the procedure.

Patient was placed prone. Left flank was prepped and draped in
sterile fashion. Ultrasound demonstrated left hydronephrosis and
echogenic left renal stones. Skin was anesthetized using 1%
lidocaine. Small incision was made. Using ultrasound guidance, 21
gauge needle was directed into the lower pole collecting system just
distal to an infundibular stone. Contrast injection confirmed
placement in the renal collecting system. A 0.018 wire was easily
advanced into the renal pelvis. A transitional dilator set was
placed. Bentson wire was advanced into the upper pole of the left
kidney. Tract was dilated to accommodate a 10 French multipurpose
drain. Due to the large renal pelvic stone, the pigtail catheter was
placed in the upper pole collecting system. Contrast injection
confirmed placement and catheter was attached to a gravity bag.
Drain was secured to the skin with suture.
FINDINGS: Large stone in the left renal pelvis and another prominent stone
involving a lower pole infundibulum. Needle was directed into
collecting system just distal to the infundibular stone. Not clear
if the needle was placed within the infundibulum or a small calyx.
10 French drain was placed with the pigtail in the upper pole.
IMPRESSION: 1. Successful placement of a left percutaneous nephrostomy tube.
2. This percutaneous access could potentially be used for a
percutaneous nephrolithotomy procedure but not clear if the drain is
entering through a calyx versus distal aspect of an infundibulum.

## 2023-02-14 IMAGING — RF DG C-ARM 1-60 MIN
1 series · 5 of 5 positions shown · non-contrast
Comparison: CT 02/25/2021.

CLINICAL DATA: Surgery

EXAM:
ABDOMEN - 1 VIEW; DG C-ARM 1-60 MIN

[Series 1: unknown protocol · 0.14mm/px · 5 of 5 slices shown]
[im 1/5]
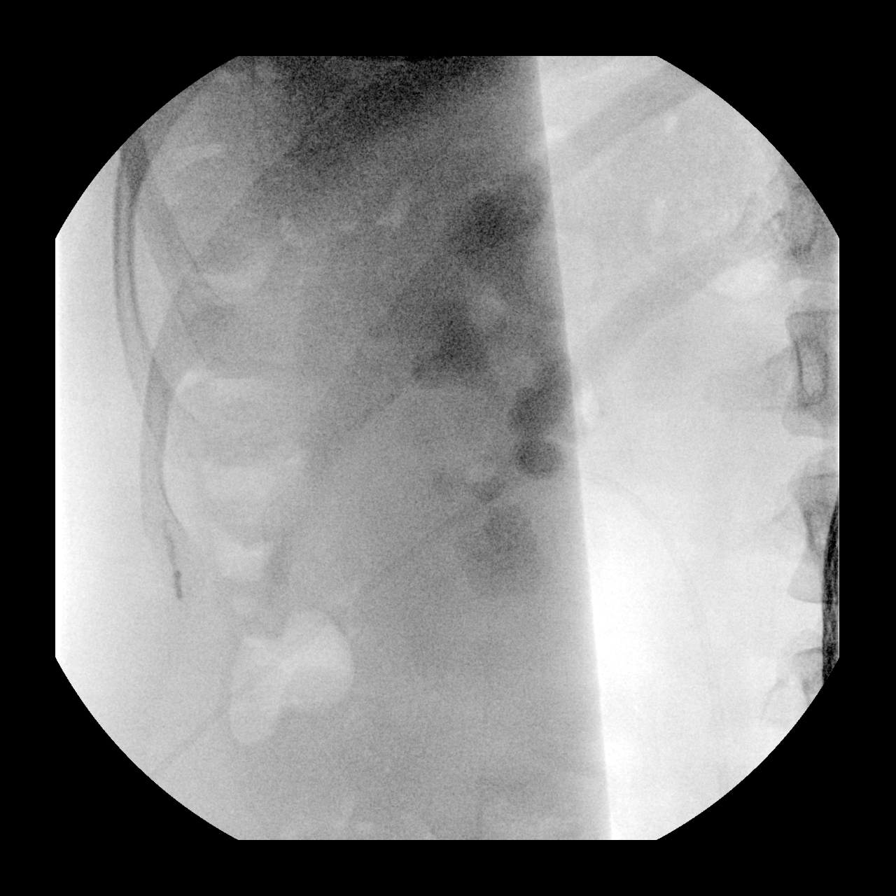
[im 2/5]
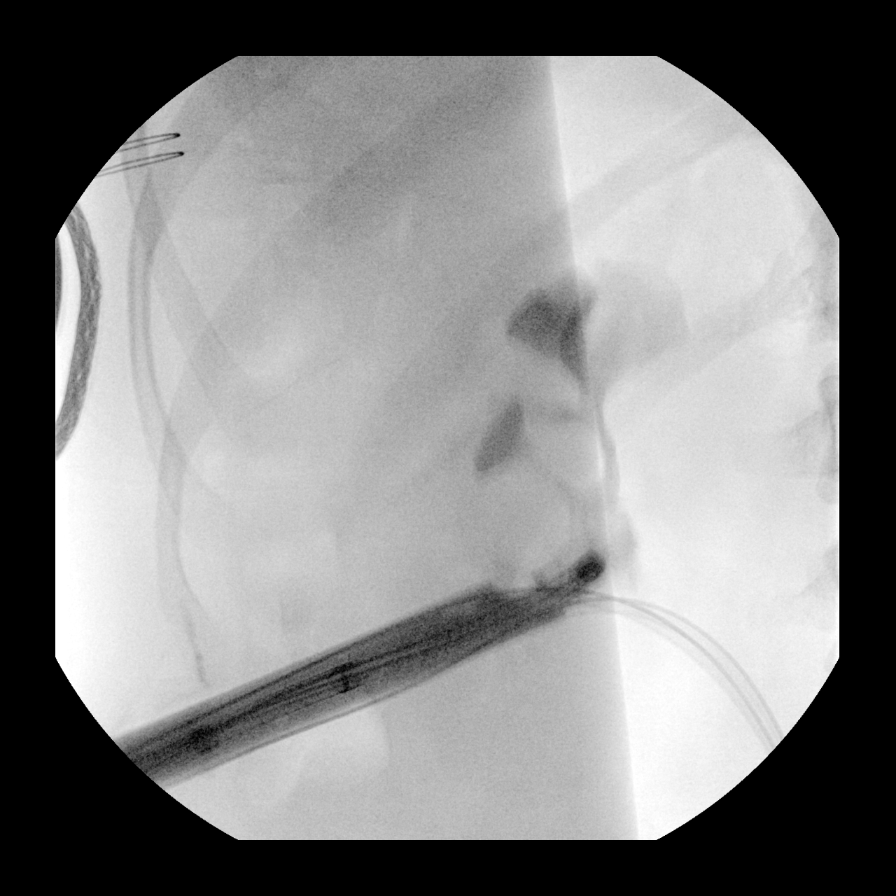
[im 3/5]
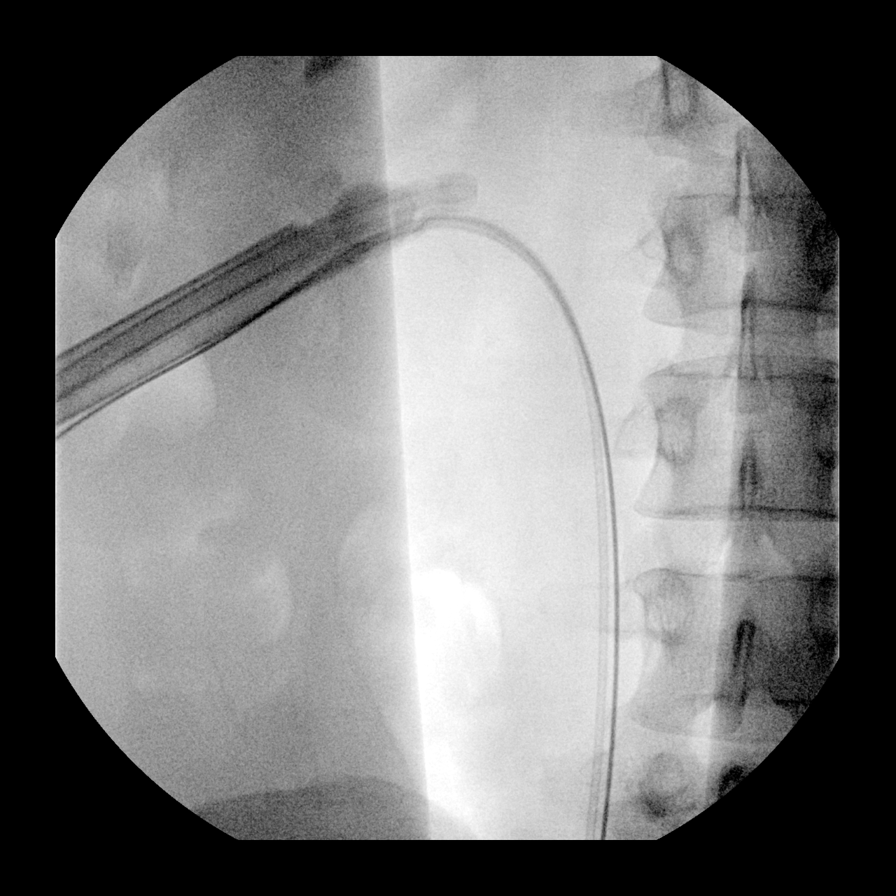
[im 4/5]
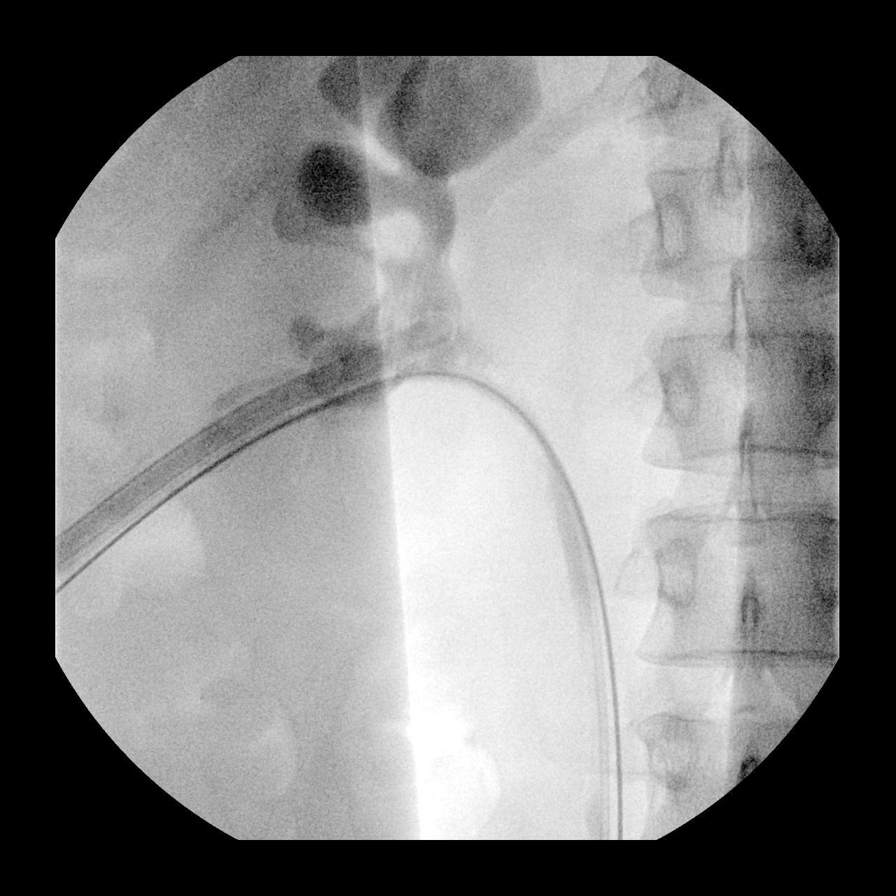
[im 5/5]
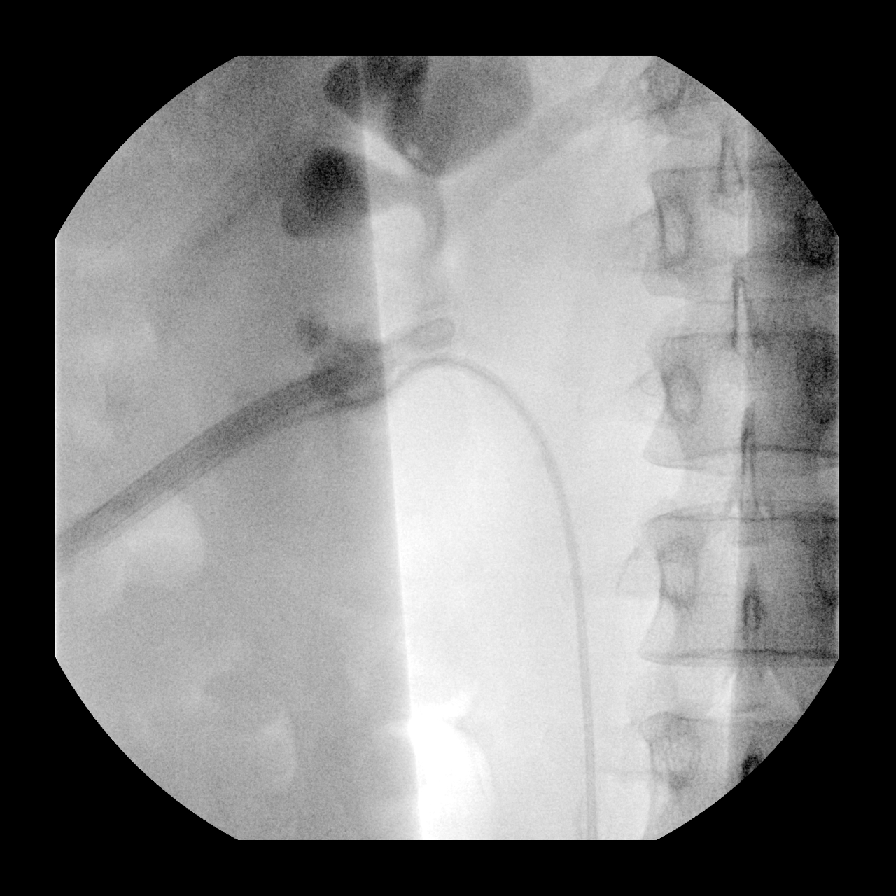

[5 of 5 positions shown; findings below may reference images not displayed]

FINDINGS: Intraoperative spot images demonstrate injection of contrast through
left nephrostomy catheter filling dilated left upper pole renal
collecting system. Partially visualized is a stent and guidewire
within the left ureter.
IMPRESSION: Intraoperative imaging as above.

## 2023-02-14 IMAGING — RF DG ABDOMEN 1V
1 series · 5 of 5 positions shown · non-contrast
Comparison: CT 02/25/2021.

CLINICAL DATA: Surgery

EXAM:
ABDOMEN - 1 VIEW; DG C-ARM 1-60 MIN

[Series 1: unknown protocol · 0.14mm/px · 5 of 5 slices shown]
[im 1/5]
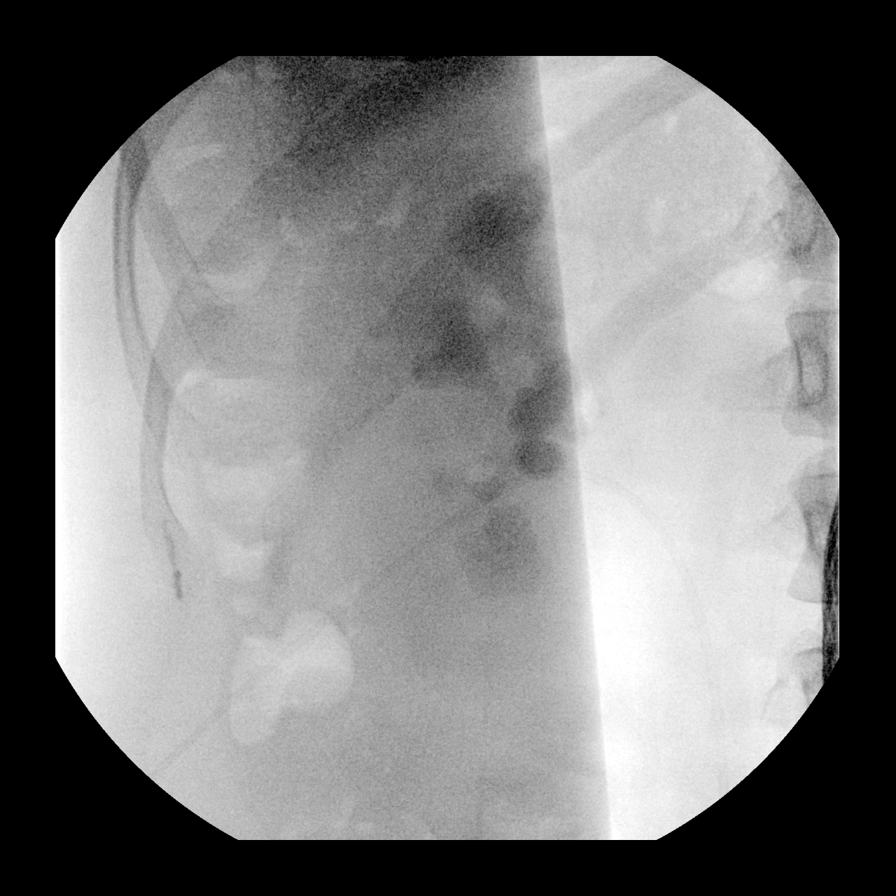
[im 2/5]
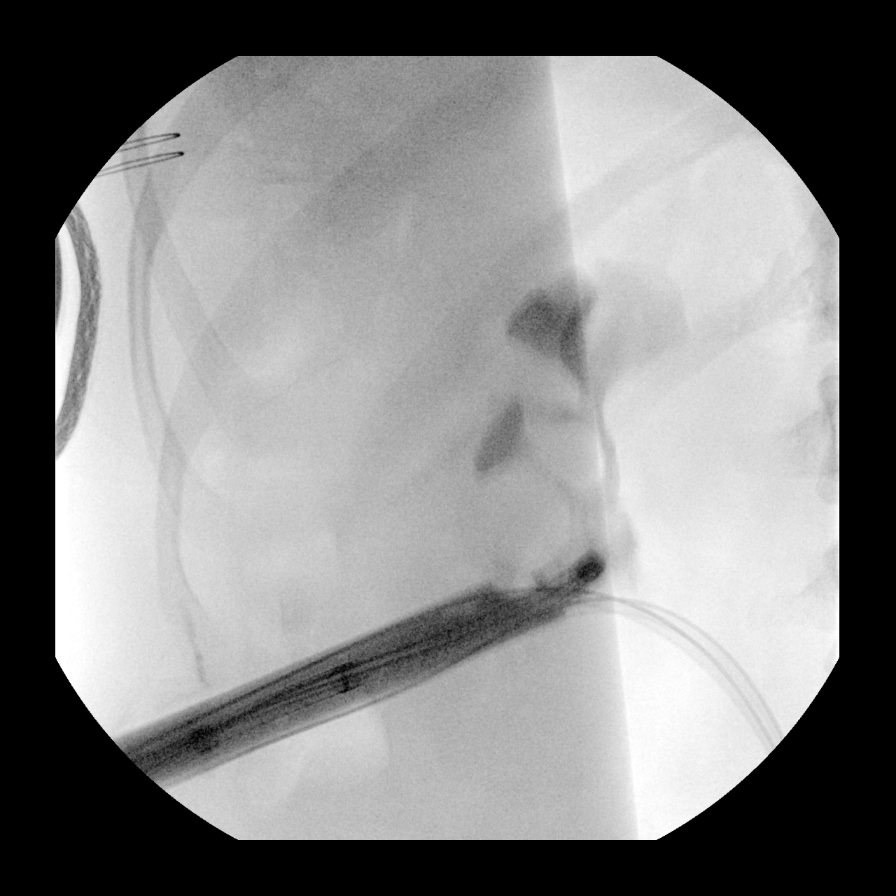
[im 3/5]
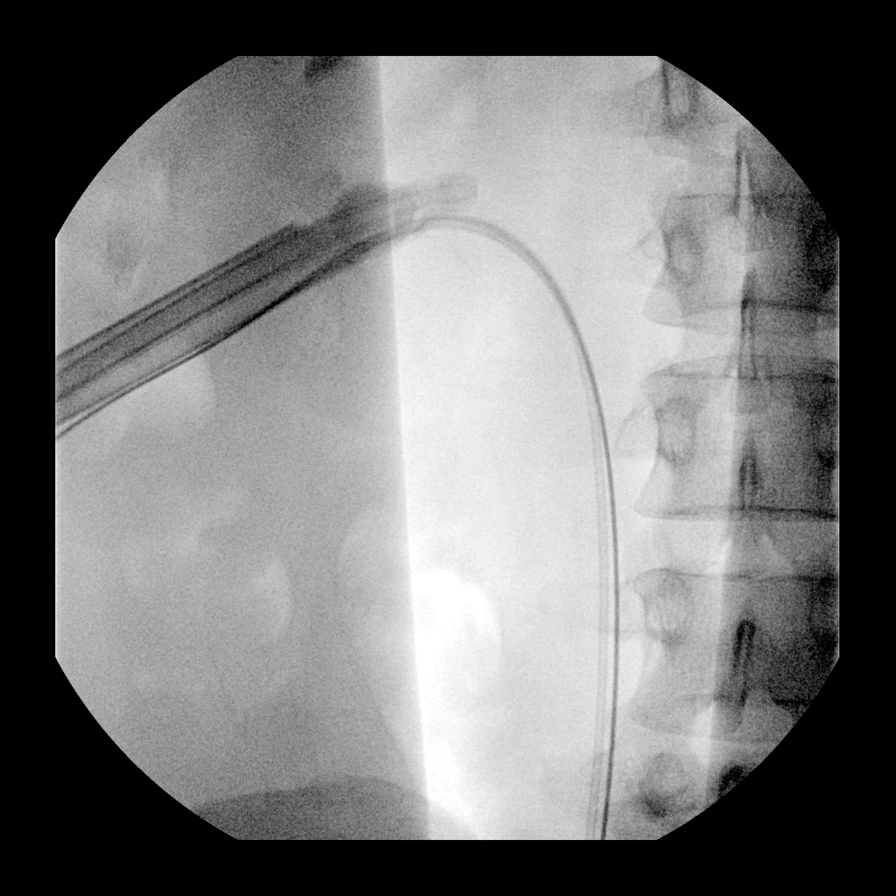
[im 4/5]
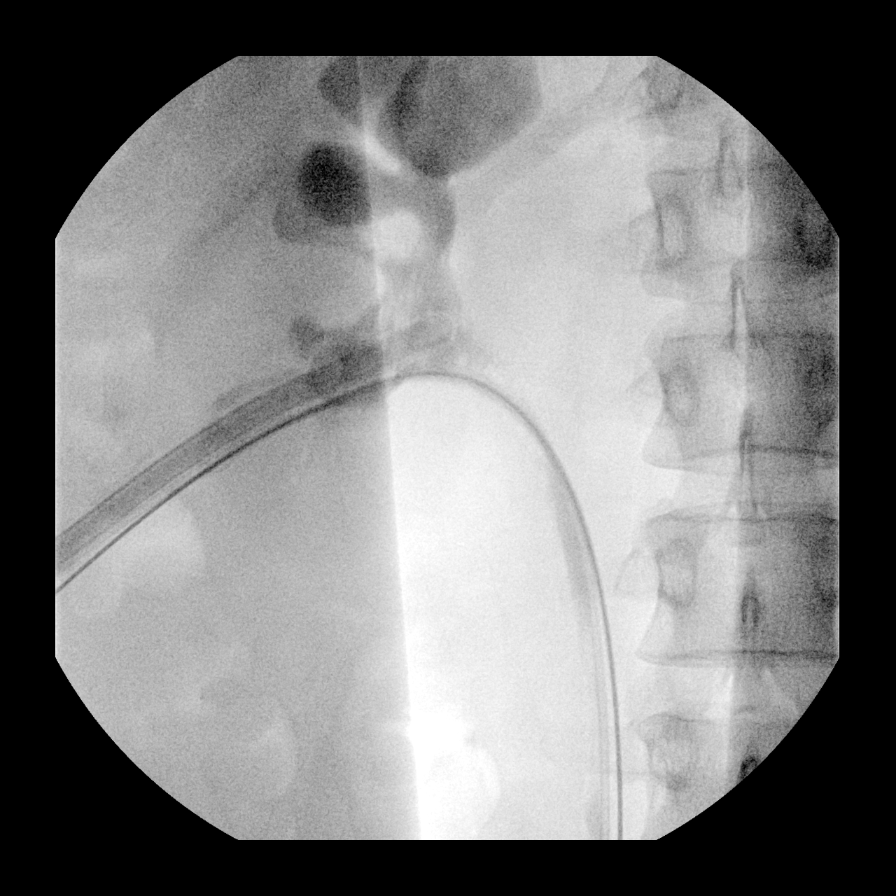
[im 5/5]
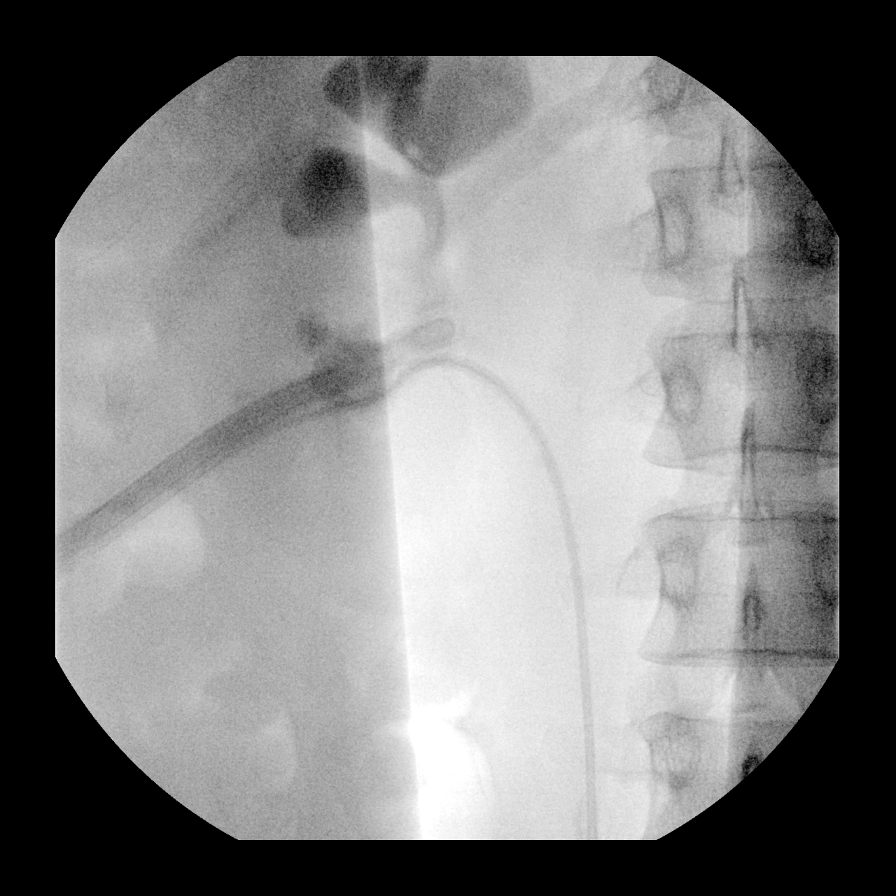

[5 of 5 positions shown; findings below may reference images not displayed]

FINDINGS: Intraoperative spot images demonstrate injection of contrast through
left nephrostomy catheter filling dilated left upper pole renal
collecting system. Partially visualized is a stent and guidewire
within the left ureter.
IMPRESSION: Intraoperative imaging as above.

## 2023-03-29 IMAGING — CR DG ABDOMEN 1V
1 series · 1 of 1 positions shown · non-contrast
Comparison: None.

CLINICAL DATA: Nephrolithiasis

EXAM:
ABDOMEN - 1 VIEW

[abdomen kub]
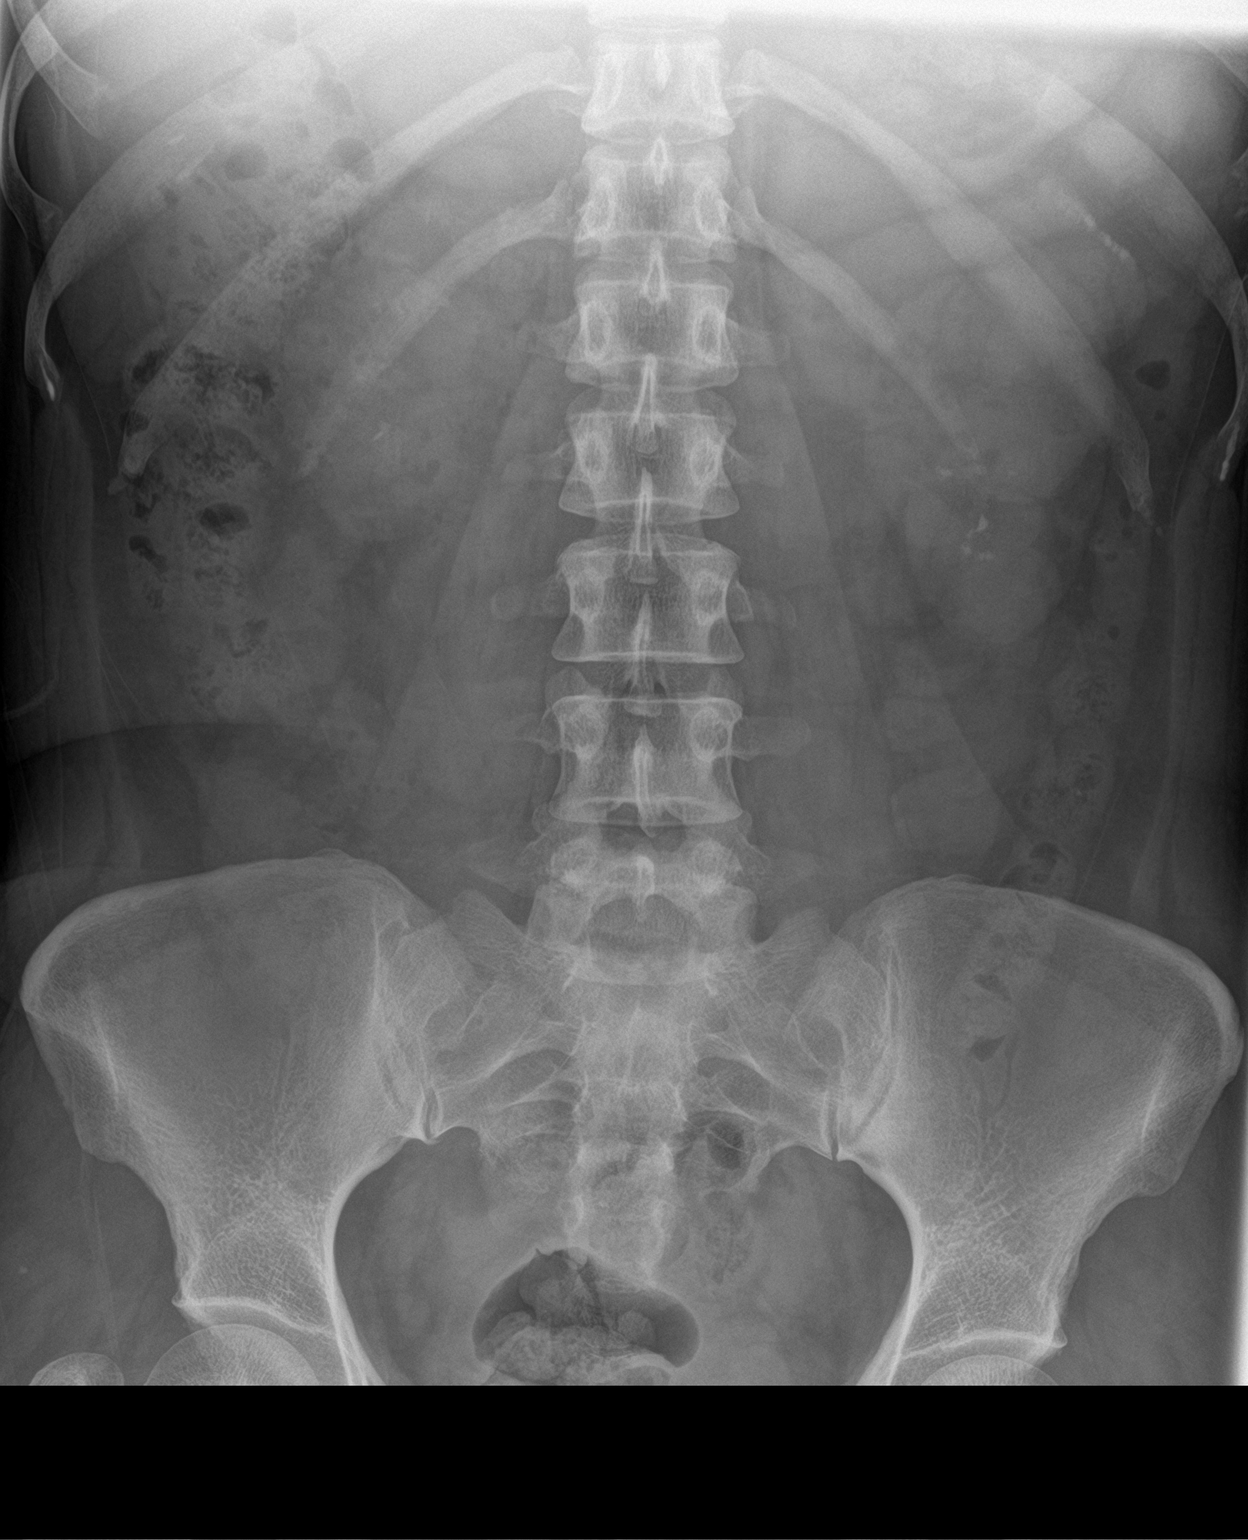

[1 of 1 positions shown; findings below may reference images not displayed]

FINDINGS: Normal abdominal gas pattern. No gross free intraperitoneal gas.
There are multiple small calculi overlying the mid and lower pole of
the kidneys bilaterally. On the left, there are at least 8 measuring
up to 4 mm in size. On the right, there is are numerous punctate
calculi with a single calculus measuring up to 6 mm within the lower
pole. No definite ureteral calculi. The osseous structures are
unremarkable. No organomegaly.
IMPRESSION: Bilateral nephrolithiasis as described above. No definite
urolithiasis.

## 2023-07-17 ENCOUNTER — Ambulatory Visit
Admission: RE | Admit: 2023-07-17 | Discharge: 2023-07-17 | Disposition: A | Discharge status: Home / Self Care | Payer: Self-pay | Source: Ambulatory Visit | Attending: Urology | Admitting: Urology

## 2023-07-17 DIAGNOSIS — N200 Calculus of kidney: Secondary | ICD-10-CM

## 2023-07-23 ENCOUNTER — Ambulatory Visit (INDEPENDENT_AMBULATORY_CARE_PROVIDER_SITE_OTHER): Payer: Self-pay | Admitting: Urology

## 2023-07-23 VITALS — BP 136/57 | HR 73 | Ht 62.0 in | Wt 153.0 lb

## 2023-07-23 DIAGNOSIS — N200 Calculus of kidney: Secondary | ICD-10-CM

## 2023-07-23 NOTE — Progress Notes (Signed)
Marcelle Overlie Plume,acting as a scribe for [name], MD.,have documented all relevant documentation on the behalf of [name], MD,as directed by  [name], MD while in the presence of [name], MD.  07/23/2023 9:57 AM   Julie Austin 07/19/1981 [id number]  Referring provider: Center, [facility] [address] [place],  [place] [address]  Chief Complaint  Patient presents with   Nephrolithiasis    HPI: 42 year-old female with a personal history of left calculus returns today for a routine annual follow up.   She underwent PCNL in 02/2021. 24 hour urine showed hypercalciuria.  She elected not to pursue medical management.  She has a stable, unchanged, right lower pole stone measuring 4 mm on today's KUB. This was personally reviewed.  She has not experienced any pain or stone episodes and continues to drink plenty of fluids. There have been no urinary tract infections reported.   PMH: Past Medical History:  Diagnosis Date   Anemia    blood transfusion 4th delivery; states last 2 deliveries lost blood and rec'd IV supplement after delivery   Blood transfusion without reported diagnosis 03/10/2003   reports shoulder dystocia and had blood transfusion after delivery (pregnancy #4)   Depression    states PP depression with pregnancy #6 but no meds   Gestational diabetes    states with pregnancy #6   History of urinary tract infection    Kidney stones 12/2020   states problem with kidney stones began 12/2020 and was told body produces a lot of calcium   Vaginal itching    did not mention this during nurse interview prior to new OB appt   Vaginal Pap smear, abnormal    leep procedure at [facility]    Surgical History: Past Surgical History:  Procedure Laterality Date   IR CONVERT LEFT NEPHROSTOMY TO NEPHROURETERAL CATH  03/06/2021   IR NEPHROSTOMY PLACEMENT LEFT  02/26/2021   LEEP     [facility] (thinks this was procedure as done under general  anesthesia due to an abnormal PAP)   NEPHROLITHOTOMY Left 03/06/2021   Procedure: NEPHROLITHOTOMY PERCUTANEOUS;  Surgeon: [name], MD;  Location: [facility] ORS;  Service: Urology;  Laterality: Left;    Home Medications:  Allergies as of 07/23/2023   No Known Allergies      Medication List        Accurate as of July 23, 2023  9:57 AM. If you have any questions, ask your nurse or doctor.          STOP taking these medications    Prenatal Vitamin 27-0.8 MG Tabs        Family History: Family History  Problem Relation Age of Onset   Migraines Mother    Diabetes Father    Healthy Sister    Healthy Sister    Healthy Sister    Healthy Sister    Healthy Sister    Healthy Daughter    Healthy Son    Healthy Son    Healthy Son    Polycystic kidney disease Son    Healthy Son    Healthy Son    Diabetes Maternal Grandmother    Heart disease Maternal Grandmother    Diabetes Maternal Grandfather    Diabetes Paternal Grandmother    Diabetes Paternal Grandfather     Social History:  reports that she has never smoked. She has been exposed to tobacco smoke. She has never used smokeless tobacco. She reports that she does not currently use  alcohol after a past usage of about 2.0 standard drinks of alcohol per week. She reports that she does not use drugs.   Physical Exam: BP (!) 136/57   Pulse 73   Ht 5\' 2"  (1.575 m)   Wt 153 lb (69.4 kg)   BMI 27.98 kg/m   Constitutional:  Alert and oriented, No acute distress. HEENT: [place] AT, moist mucus membranes.  Trachea midline, no masses. Neurologic: Grossly intact, no focal deficits, moving all 4 extremities. Psychiatric: Normal mood and affect.  Pertinent Imaging:  KUB performed today that was personally reviewed. There is a 4 mm right lower pole stone. No change from previous year. Radiologic interpretation is pending.    Assessment & Plan:    1. Right nephrolithiasis - Recommend repeat KUB in 2 years as there is  minimal stone burden with no growth over the past year - We discussed general stone prevention techniques including drinking plenty water with goal of producing 2.5 L urine daily, increased citric acid intake, avoidance of high oxalate containing foods, and decreased salt intake.   -Does not appear to be metabolically active, stone burden was stable and minimal, agree with conservative management for the time being  Return in about 2 years (around 07/22/2025) for repeat KUB.  I have reviewed the above documentation for accuracy and completeness, and I agree with the above.   [name], MD   [facility] [address], [address] [place], [place] [address] [phone]

## 2024-01-06 ENCOUNTER — Ambulatory Visit: Payer: Self-pay

## 2024-01-06 DIAGNOSIS — Z23 Encounter for immunization: Secondary | ICD-10-CM

## 2024-01-06 DIAGNOSIS — Z719 Counseling, unspecified: Secondary | ICD-10-CM

## 2024-01-06 NOTE — Progress Notes (Signed)
 In nurse clinic for immunizations, requesting Covid Comirnaty 12+ vaccine. Voices no concerns. VIS reviewed and given to patient. Vaccine tolerated well; no issues noted. [facility] updated and copy given to patient.   Abagail Kitchens, RN

## 2024-04-30 ENCOUNTER — Ambulatory Visit: Payer: Self-pay | Admitting: Nurse Practitioner

## 2024-04-30 ENCOUNTER — Encounter: Payer: Self-pay | Admitting: Nurse Practitioner

## 2024-04-30 ENCOUNTER — Telehealth: Payer: Self-pay | Admitting: *Deleted

## 2024-04-30 VITALS — BP 102/67 | HR 78 | Ht 61.0 in | Wt 151.0 lb

## 2024-04-30 DIAGNOSIS — Z30433 Encounter for removal and reinsertion of intrauterine contraceptive device: Secondary | ICD-10-CM

## 2024-04-30 DIAGNOSIS — Z3009 Encounter for other general counseling and advice on contraception: Secondary | ICD-10-CM

## 2024-04-30 DIAGNOSIS — Z113 Encounter for screening for infections with a predominantly sexual mode of transmission: Secondary | ICD-10-CM

## 2024-04-30 DIAGNOSIS — Z01419 Encounter for gynecological examination (general) (routine) without abnormal findings: Secondary | ICD-10-CM

## 2024-04-30 DIAGNOSIS — Z30432 Encounter for removal of intrauterine contraceptive device: Secondary | ICD-10-CM

## 2024-04-30 LAB — WET PREP FOR TRICH, YEAST, CLUE
Clue Cell Exam: NEGATIVE
Trichomonas Exam: NEGATIVE
Yeast Exam: NEGATIVE

## 2024-04-30 LAB — HM HIV SCREENING LAB: HM HIV Screening: NEGATIVE

## 2024-04-30 MED ORDER — LEVONORGESTREL 20.1 MCG/DAY IU IUD
1.0000 | INTRAUTERINE_SYSTEM | Freq: Once | INTRAUTERINE | Status: AC
Start: 2024-04-30 — End: 2024-04-30
  Administered 2024-04-30: 1 via INTRAUTERINE
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
## 2024-04-30 NOTE — Progress Notes (Signed)
 Pt is here for PE, IUD removal and reinsertion. IUD removed and new one inserted successfully today by Macario craven, MD assisted by J. Idol, NP. Patient tolerated well to removal and  reinsertion process with no complications. Wet prep results reviewed with with pt and requires no treatment. Opportunity given to patient to ask questions for any clarification. Questions answered. Wilkie Drought, RN.

## 2024-05-14 ENCOUNTER — Encounter: Payer: Self-pay | Admitting: Nurse Practitioner

## 2024-06-01 ENCOUNTER — Ambulatory Visit: Payer: Self-pay | Admitting: Nurse Practitioner

## 2024-06-01 ENCOUNTER — Encounter: Payer: Self-pay | Admitting: Nurse Practitioner

## 2024-06-01 VITALS — BP 115/67 | HR 95 | Ht 61.0 in | Wt 143.8 lb

## 2024-06-01 DIAGNOSIS — N898 Other specified noninflammatory disorders of vagina: Secondary | ICD-10-CM

## 2024-06-01 DIAGNOSIS — Z30431 Encounter for routine checking of intrauterine contraceptive device: Secondary | ICD-10-CM

## 2024-06-01 DIAGNOSIS — Z3009 Encounter for other general counseling and advice on contraception: Secondary | ICD-10-CM

## 2024-06-01 NOTE — Progress Notes (Signed)
 Referral faxed to [facility] at [place]. Larraine JONELLE Northern, RN

## 2024-06-01 NOTE — Progress Notes (Signed)
   [facility] Problem Visit  [facility]  Subjective:  Julie Austin is a 43 y.o. being seen today for IUD string check.  Chief Complaint  Patient presents with   Acute Visit    Pt presents for IUD string check 1 month post placement. Pt thinks she can feel the strings but also thinks she might be feeling the device. She reports that she feels something like a finger that she has not felt before. She is bleeding lightly  She denies pain, pain with sex, fever, chills, rapid weight gain/loss, or heavy bleeding.   Does the patient have a current or past history of drug use? No   No components found for: HCV]   Health Maintenance Due  Topic Date Due   DTaP/Tdap/Td (1 - Tdap) Never done   Hepatitis B Vaccines (1 of 3 - 19+ 3-dose series) Never done   HPV VACCINES (1 - 3-dose SCDM series) Never done   INFLUENZA VACCINE  05/29/2024    The following portions of the patient's history were reviewed and updated as appropriate: allergies, current medications, past family history, past medical history, past social history, past surgical history and problem list. Problem list updated.   See flowsheet for other program required questions.  Objective:   Vitals:   06/01/24 1309  BP: 115/67  Pulse: 95  Weight: 143 lb 12.8 oz (65.2 kg)  Height: 5' 1 (1.549 m)    Physical Exam Vitals and nursing note reviewed. Exam conducted with a chaperone present Julie Austin and Dr. Macario).  Constitutional:      Appearance: Normal appearance.  HENT:     Head: Normocephalic.  Pulmonary:     Effort: Pulmonary effort is normal.  Genitourinary:    Vagina: Bleeding (small amount of blood in vault) present. No tenderness.     Cervix: No cervical motion tenderness.     Rectum: Normal.        Comments: Blue strings visualized  Cervix not visualized d/t large red nontender mass obstructing view. Musculoskeletal:        General: Normal range of motion.   Skin:    General: Skin is warm and dry.  Neurological:     Mental Status: She is alert. Mental status is at baseline.  Psychiatric:        Mood and Affect: Mood normal.        Behavior: Behavior normal.       Assessment and Plan:  Julie Austin is a 43 y.o. female presenting to the [facility] Department for a Women's Health problem visit  1. IUD check up (Primary) Strings visualized, but cervix not seen. D/t mass, unable to be certain that IUD is in the correct place Advised pt to use back up birth control and given condoms.  2. Vaginal mass Ddx: polyp, benign tumor, malignant tumor, cervical eversion Consulted with Dr. Macario who also was unable to view cervix past the mass Faxed urgent referral to [facility] for complete pelvic ultrasound and gynecology consult Pt aware of plan of care.   Return if symptoms worsen or fail to improve.  No future appointments.   Julie Vitug K Kohen Reither, NP

## 2024-07-14 ENCOUNTER — Ambulatory Visit (LOCAL_COMMUNITY_HEALTH_CENTER): Payer: Self-pay

## 2024-07-14 ENCOUNTER — Other Ambulatory Visit: Payer: Self-pay

## 2024-07-14 VITALS — BP 110/61 | HR 67 | Wt 146.8 lb

## 2024-07-14 DIAGNOSIS — H538 Other visual disturbances: Secondary | ICD-10-CM

## 2024-07-14 DIAGNOSIS — H5789 Other specified disorders of eye and adnexa: Secondary | ICD-10-CM | POA: Insufficient documentation

## 2024-07-14 DIAGNOSIS — N939 Abnormal uterine and vaginal bleeding, unspecified: Secondary | ICD-10-CM

## 2024-07-14 DIAGNOSIS — Z5321 Procedure and treatment not carried out due to patient leaving prior to being seen by health care provider: Secondary | ICD-10-CM | POA: Insufficient documentation

## 2024-07-14 DIAGNOSIS — R1030 Lower abdominal pain, unspecified: Secondary | ICD-10-CM | POA: Insufficient documentation

## 2024-07-14 DIAGNOSIS — Z309 Encounter for contraceptive management, unspecified: Secondary | ICD-10-CM

## 2024-07-14 LAB — BASIC METABOLIC PANEL WITH GFR
Anion gap: 10 (ref 5–15)
BUN: 18 mg/dL (ref 6–20)
CO2: 25 mmol/L (ref 22–32)
Calcium: 9.7 mg/dL (ref 8.9–10.3)
Chloride: 104 mmol/L (ref 98–111)
Creatinine, Ser: 0.96 mg/dL (ref 0.44–1.00)
GFR, Estimated: >60 mL/min (ref >60)
Glucose, Bld: 97 mg/dL (ref 70–99)
Potassium: 4 mmol/L (ref 3.5–5.1)
Sodium: 139 mmol/L (ref 135–145)

## 2024-07-14 LAB — CBC WITH DIFFERENTIAL/PLATELET
Abs Immature Granulocytes: 0.03 K/uL (ref 0.00–0.07)
Basophils Absolute: 0.1 K/uL (ref 0.0–0.1)
Basophils Relative: 1 %
Eosinophils Absolute: 0.4 K/uL (ref 0.0–0.5)
Eosinophils Relative: 5 %
HCT: 32.9 % — ABNORMAL LOW (ref 36.0–46.0)
Hemoglobin: 9.6 g/dL — ABNORMAL LOW (ref 12.0–15.0)
Immature Granulocytes: 0 %
Lymphocytes Relative: 30 %
Lymphs Abs: 2.2 K/uL (ref 0.7–4.0)
MCH: 24 pg — ABNORMAL LOW (ref 26.0–34.0)
MCHC: 29.2 g/dL — ABNORMAL LOW (ref 30.0–36.0)
MCV: 82.3 fL (ref 80.0–100.0)
Monocytes Absolute: 0.4 K/uL (ref 0.1–1.0)
Monocytes Relative: 6 %
Neutro Abs: 4.2 K/uL (ref 1.7–7.7)
Neutrophils Relative %: 58 %
Platelets: 416 K/uL — ABNORMAL HIGH (ref 150–400)
RBC: 4 MIL/uL (ref 3.87–5.11)
RDW: 22.1 % — ABNORMAL HIGH (ref 11.5–15.5)
Smear Review: NORMAL
WBC: 7.2 K/uL (ref 4.0–10.5)
nRBC: 0 % (ref 0.0–0.2)

## 2024-07-14 LAB — POC URINE PREG, ED: Preg Test, Ur: NEGATIVE

## 2024-07-14 LAB — URINALYSIS, ROUTINE W REFLEX MICROSCOPIC
Bilirubin Urine: NEGATIVE
Glucose, UA: NEGATIVE mg/dL
Ketones, ur: NEGATIVE mg/dL
Nitrite: NEGATIVE
Protein, ur: NEGATIVE mg/dL
Specific Gravity, Urine: 1.011 (ref 1.005–1.030)
Squamous Epithelial / HPF: 0 /HPF (ref 0–5)
pH: 6 (ref 5.0–8.0)

## 2024-07-14 NOTE — Progress Notes (Signed)
 [employer] HE[facility] DEPARTMENT [facility] [address] [place] [address] Main phone: [phone]  Family Planning Visit - Repeat Yearly Visit  Subjective:  Julie Austin is a 43 y.o. H1E1991  being seen today for vaginal bleeding.  Patient has the following medical problems:  Patient Active Problem List   Diagnosis Date Noted   Hx pp hemorrhage per pt report 02/08/2022   History of loop electrical excision procedure (LEEP) at [facility] 09/15/2021   History of kidney stones 07/24/2021   History of depression 07/24/2021   Left nephrolithiasis 03/06/2021   Acute pyelonephritis 02/27/2021   Hydronephrosis with urinary obstruction due to ureteral calculus    Kidney stone on left side 02/25/2021   Sepsis (HCC) 02/25/2021   hx of gestational diabetes 2016 12/27/2020   hx pp depression 2016 12/27/2020   Chief Complaint  Patient presents with   Acute Visit   HPI Patient reports she is waiting for a surgery date for her polyp removal, scheduling is pending her application at [facility] for financial assistance.  Patient reports heavy vaginal bleeding and believes her IUD was expelled with a blood clot. Believe she saw the device within a clot in August. Showed provider picture of a palm sized clot. Changing tampon every 2 hours - ultra sized tampon. May desire a hysterectomy.  Uses condoms for sex.  She was given iron pills by a provider.  Sunday morning woke up with her eye red, vision blurred.   Review of Systems  Eyes:  Positive for blurred vision and redness.  Neurological:  Positive for dizziness.  All other systems reviewed and are negative.   See flowsheet for further details and programmatic requirements Hyperlink available at the top of the signed note in blue.  Flow sheet content below:  Pregnancy Intention Screening Does the patient want to become pregnant in the next year?: No Does the patient's partner want to become pregnant  in the next year?: No Would the patient like to discuss contraceptive options today?: No  Cervical Cancer Screening  Result Date Procedure Results Follow-ups  12/27/2020 IGP, Aptima HPV DIAGNOSIS:: Comment Specimen adequacy:: Comment Clinician Provided ICD10: Comment Performed by:: Comment QC reviewed by:: Comment PAP Smear Comment: . Note:: Comment Test Methodology: Comment HPV Aptima: Negative    Health Maintenance Due  Topic Date Due   DTaP/Tdap/Td (1 - Tdap) Never done   Hepatitis B Vaccines 19-59 Average Risk (1 of 3 - 19+ 3-dose series) Never done   HPV VACCINES (1 - 3-dose SCDM series) Never done   Mammogram  Never done   Influenza Vaccine  05/29/2024   The following portions of the patient's history were reviewed and updated as appropriate: allergies, current medications, past family history, past medical history, past social history, past surgical history and problem list. Problem list updated.  Objective:   Vitals:   07/14/24 1400  BP: 110/61  Pulse: 67  Weight: 146 lb 12.8 oz (66.6 kg)   Physical Exam Constitutional:      Appearance: Normal appearance.  HENT:     Head: Normocephalic and atraumatic.  Eyes:     General: Visual field deficit present.        Right eye: No discharge.        Left eye: No discharge.     Conjunctiva/sclera:     Left eye: Left conjunctiva is injected. Hemorrhage present.     Comments: Right lateral eye injected, with a white discoloration of edge of iris on right  lateral portion  Pulmonary:     Effort: Pulmonary effort is normal.  Abdominal:     Palpations: Abdomen is soft.  Musculoskeletal:        General: Normal range of motion.  Skin:    General: Skin is warm and dry.  Neurological:     General: No focal deficit present.     Mental Status: She is alert.  Psychiatric:        Mood and Affect: Mood normal.        Behavior: Behavior normal.    Assessment and Plan:  Julie Austin is a 43 y.o. female [phone]  presenting to the [facility] Department for an yearly wellness and contraception visit  1. Vaginal bleeding (Primary)  - Waiting to schedule polyp removal with [facility], awaiting financial assistance approval - Passing large clots, wearing tampons and changing every 2 hours - Showed picture with clot size of her palm - Advised going to ED if she has clots this large, or if she saturates a pad/tampon every hour, or if she becomes very dizzy - Discussed that she needs the surgery as she has a structural cause of her bleeding - She declines contraception today (offered pills, Depo) - Discussed she can try to slow her bleeding by taking 600 mg of ibuprofen every 6 hours for 5-10 days   2. Blurred vision  - New onset blurred vision of right eye, with disruption of her peripheral vision - Right eye injected, with a white line along the edge of her right lateral iris. Iris is misshapen along edge. - Advised urgent/emergent eye evaluation. She can call her eye doctor for an urgent visit (wears glasses), or better yet go to ED for emergent evaluation. - She does not recall any eye injury - Pupils equal, round, reactive to light  Return if symptoms worsen or fail to improve. Seek emergent eye evaluation   No future appointments.  Damien FORBES Satchel, NP

## 2024-07-14 NOTE — ED Triage Notes (Signed)
 Pt reports heavy vaginal bleeding for the past month, pt denies possible pregnancy. Pt also reports redness to her right eye that began Sunday morning. Pt reports some lower abd cramping, denies dysuria

## 2024-07-15 ENCOUNTER — Emergency Department
Admission: EM | Admit: 2024-07-15 | Discharge: 2024-07-15 | Disposition: A | Discharge status: Home / Self Care | Payer: Self-pay | Attending: Emergency Medicine | Admitting: Emergency Medicine

## 2024-07-15 ENCOUNTER — Other Ambulatory Visit: Payer: Self-pay

## 2024-07-15 ENCOUNTER — Emergency Department
Admission: EM | Admit: 2024-07-15 | Discharge: 2024-07-15 | Discharge status: Against Medical Advice | Payer: Self-pay | Attending: Emergency Medicine | Admitting: Emergency Medicine

## 2024-07-15 ENCOUNTER — Emergency Department: Payer: Self-pay

## 2024-07-15 DIAGNOSIS — R42 Dizziness and giddiness: Secondary | ICD-10-CM

## 2024-07-15 DIAGNOSIS — D649 Anemia, unspecified: Secondary | ICD-10-CM | POA: Insufficient documentation

## 2024-07-15 DIAGNOSIS — H1131 Conjunctival hemorrhage, right eye: Secondary | ICD-10-CM | POA: Insufficient documentation

## 2024-07-15 LAB — CBC WITH DIFFERENTIAL/PLATELET
Abs Immature Granulocytes: 0.06 K/uL (ref 0.00–0.07)
Basophils Absolute: 0.1 K/uL (ref 0.0–0.1)
Basophils Relative: 1 %
Eosinophils Absolute: 0.3 K/uL (ref 0.0–0.5)
Eosinophils Relative: 5 %
HCT: 33.9 % — ABNORMAL LOW (ref 36.0–46.0)
Hemoglobin: 10.1 g/dL — ABNORMAL LOW (ref 12.0–15.0)
Immature Granulocytes: 1 %
Lymphocytes Relative: 33 %
Lymphs Abs: 2.1 K/uL (ref 0.7–4.0)
MCH: 24.3 pg — ABNORMAL LOW (ref 26.0–34.0)
MCHC: 29.8 g/dL — ABNORMAL LOW (ref 30.0–36.0)
MCV: 81.7 fL (ref 80.0–100.0)
Monocytes Absolute: 0.4 K/uL (ref 0.1–1.0)
Monocytes Relative: 6 %
Neutro Abs: 3.4 K/uL (ref 1.7–7.7)
Neutrophils Relative %: 54 %
Platelets: 414 K/uL — ABNORMAL HIGH (ref 150–400)
RBC: 4.15 MIL/uL (ref 3.87–5.11)
RDW: 22.1 % — ABNORMAL HIGH (ref 11.5–15.5)
WBC: 6.3 K/uL (ref 4.0–10.5)
nRBC: 0 % (ref 0.0–0.2)

## 2024-07-15 LAB — POC URINE PREG, ED: Preg Test, Ur: NEGATIVE

## 2024-07-15 LAB — URINALYSIS, ROUTINE W REFLEX MICROSCOPIC
Bilirubin Urine: NEGATIVE
Glucose, UA: NEGATIVE mg/dL
Hgb urine dipstick: NEGATIVE
Ketones, ur: NEGATIVE mg/dL
Nitrite: NEGATIVE
Protein, ur: NEGATIVE mg/dL
Specific Gravity, Urine: 1.014 (ref 1.005–1.030)
Squamous Epithelial / HPF: 0 /HPF (ref 0–5)
pH: 7 (ref 5.0–8.0)

## 2024-07-15 LAB — BASIC METABOLIC PANEL WITH GFR
Anion gap: 9 (ref 5–15)
BUN: 17 mg/dL (ref 6–20)
CO2: 27 mmol/L (ref 22–32)
Calcium: 9.2 mg/dL (ref 8.9–10.3)
Chloride: 104 mmol/L (ref 98–111)
Creatinine, Ser: 0.83 mg/dL (ref 0.44–1.00)
GFR, Estimated: >60 mL/min (ref >60)
Glucose, Bld: 136 mg/dL — ABNORMAL HIGH (ref 70–99)
Potassium: 3.5 mmol/L (ref 3.5–5.1)
Sodium: 140 mmol/L (ref 135–145)

## 2024-07-15 LAB — TYPE AND SCREEN
ABO/RH(D): O POS
Antibody Screen: NEGATIVE

## 2024-07-15 MED ORDER — TETRACAINE HCL 0.5 % OP SOLN
1.0000 [drp] | Freq: Once | OPHTHALMIC | Status: AC
  Administered 2024-07-15: 1 [drp] via OPHTHALMIC
  Filled 2024-07-15: qty 4

## 2024-07-15 MED ORDER — FLUORESCEIN SODIUM 1 MG OP STRP
1.0000 | ORAL_STRIP | Freq: Once | OPHTHALMIC | Status: AC
  Administered 2024-07-15: 1 via OPHTHALMIC
  Filled 2024-07-15: qty 1

## 2024-07-15 NOTE — Discharge Instructions (Addendum)
 Your dizziness may be due to mild anemia.  Follow-up with your OB/GYN at [facility] as planned.  Return to the ER for new, worsening, or persistent severe dizziness, shortness of breath, feel like you are going to pass out, or any other new or worsening symptoms that concern you.  The redness in your eye is from a popped blood vessel in the white part of the eye.  This will go away on its own.  However if you are having some blurred vision or irritation of the eye, you should follow-up with an outpatient eye doctor.  Return to the ER for any new or worsening blurred vision, vision loss, or eye pain.

## 2024-07-15 NOTE — ED Notes (Signed)
 Urine sent

## 2024-07-15 NOTE — ED Provider Notes (Signed)
 [facility] Provider Note    None    (approximate)   History   Dizziness and Eye Pain   HPI  Julie Austin is a 43 y.o. female with a history of a cervical polyp or fibroid who presents with 2 primary complaints, dizziness and right eye symptoms.  The patient states that the dizziness has been present for the last 4 to 5 days.  She describes both a sensation of spinning or movement as well as feeling lightheaded.  A couple of times she has felt almost like she is about to pass out.  She denies any palpitations, chest pain, or difficulty breathing.  She reports persistent vaginal bleeding ongoing for the last month.  At times she has to change a pad as often as every 30 minutes, although most of the time it is more like every 2 hours.  She has passed some clots.  She is currently being evaluated for this and is planning for surgery at [facility].  The patient also reports right eye pain for the last 4 days.  She awoke that day with redness to the outside part of her right eye.  She reports pain which is sort of like an irritation.  She also reports some blurred vision to that eye, or almost like a haze or star over her visual field.  I reviewed the past medical records.  The patient was seen at the [facility] department yesterday for the vaginal bleeding.  She is waiting to schedule polyp removal at [facility].  She also reported the eye pain and blurred vision at that time and was instructed to come to the ED for evaluation.  She went to the ED last night but left without being seen.  CBC last night showed a hemoglobin of 9.6.   Physical Exam   Triage Vital Signs: ED Triage Vitals  Encounter Vitals Group     BP 07/15/24 0834 (!) 111/59     Girls Systolic BP Percentile --      Girls Diastolic BP Percentile --      Boys Systolic BP Percentile --      Boys Diastolic BP Percentile --      Pulse Rate 07/15/24 0834 85     Resp 07/15/24 0834 16     Temp  07/15/24 0834 97.9 F (36.6 C)     Temp Source 07/15/24 0834 Oral     SpO2 07/15/24 0834 100 %     Weight 07/15/24 0832 144 lb (65.3 kg)     Height 07/15/24 0832 5' (1.524 m)     Head Circumference --      Peak Flow --      Pain Score 07/15/24 0831 3     Pain Loc --      Pain Education --      Exclude from Growth Chart --     Most recent vital signs: Vitals:   07/15/24 1030 07/15/24 1045  BP:    Pulse: 70 75  Resp: (!) 25 17  Temp:    SpO2: 100% 100%     General: Alert, well-appearing, no distress.  CV:  Good peripheral perfusion.  Resp:  Normal effort.  Abd:  No distention.  Other:  Right eye with subconjunctival hemorrhage laterally.  EOMI.  PERRLA.  No photophobia.  No conjunctival injection.  No deformity or swelling.   ED Results / Procedures / Treatments   Labs (all labs ordered are listed, but only abnormal results are  displayed) Labs Reviewed  CBC WITH DIFFERENTIAL/PLATELET - Abnormal; Notable for the following components:      Result Value   Hemoglobin 10.1 (*)    HCT 33.9 (*)    MCH 24.3 (*)    MCHC 29.8 (*)    RDW 22.1 (*)    Platelets 414 (*)    All other components within normal limits  BASIC METABOLIC PANEL WITH GFR - Abnormal; Notable for the following components:   Glucose, Bld 136 (*)    All other components within normal limits  URINALYSIS, ROUTINE W REFLEX MICROSCOPIC  POC URINE PREG, ED  TYPE AND SCREEN     EKG  ED ECG REPORT I, Waylon Cassis, the attending physician, personally viewed and interpreted this ECG.  Date: 07/15/2024 EKG Time: 0835 Rate: 77 Rhythm: normal sinus rhythm QRS Axis: normal Intervals: normal ST/T Wave abnormalities: normal Narrative Interpretation: no evidence of acute ischemia    RADIOLOGY    PROCEDURES:  Critical Care performed: No  Procedures   MEDICATIONS ORDERED IN ED: Medications  tetracaine  (PONTOCAINE) 0.5 % ophthalmic solution 1 drop (1 drop Right Eye Given by Other 07/15/24  1022)  fluorescein  ophthalmic strip 1 strip (1 strip Right Eye Given by Other 07/15/24 1022)     IMPRESSION / MDM / ASSESSMENT AND PLAN / ED COURSE  I reviewed the triage vital signs and the nursing notes.  43 year old female with PMH as noted above presents with dizziness, vaginal bleeding, and right eye redness and pain.  Differential diagnosis includes, but is not limited to:  Dizziness: dehydration, electrolyte abnormality, anemia related to her vaginal bleeding, anemia from other etiology, less likely cardiac cause, peripheral vertigo.  We will obtain labs.  EKG is unremarkable.  The patient was somewhat anemic on labs from last night.  Eye redness/pain: The redness is consistent with subconjunctival hemorrhage.  However given the patient's report of eye irritation and blurred vision, differential includes corneal abrasion or ulcer, glaucoma, less likely vitreous hemorrhage.  There is no evidence of retinal detachment.  There is no evidence of uveitis.  We will check visual acuity, ocular pressure, and fluorescein  exam.  Vaginal bleeding: This is a subacute to chronic symptom and currently being worked up as an outpatient.  There is no indication for additional ED workup.  Patient's presentation is most consistent with acute complicated illness / injury requiring diagnostic workup.  ----------------------------------------- 10:54 AM on 07/15/2024 -----------------------------------------  BMP shows no acute findings.  CBC shows mild anemia although her hemoglobin is slightly improved from last night.  The patient now reports that she actually has mild blurred vision in both eyes.  Visual acuity is equal bilaterally, and 20/30 at close distances.  Ocular pressure in the affected eye is 12-13.  There is no uptake on fluorescein  exam.  At this time, the patient is stable for discharge home.  I counseled her on the results of the workup.  I have given her ophthalmology referral.  She will  follow-up with OB[facility]GYN at [facility].  I gave strict return precautions, and she expressed understanding.   FINAL CLINICAL IMPRESSION(S) / ED DIAGNOSES   Final diagnoses:  Dizziness  Anemia, unspecified type  Subconjunctival hemorrhage of right eye     Rx / DC Orders   ED Discharge Orders     None        Note:  This document was prepared using Dragon voice recognition software and may include unintentional dictation errors.    Cassis Waylon, MD 07/15/24 1055

## 2024-07-15 NOTE — ED Triage Notes (Signed)
 Coming from home. Pt LWBS last night. C/o intermittent dizziness. Pt reports heavy period that has been ongoing for a month. Pt reports she goes through a pad in 30 minutes. Dx with cervical polyp last night. Pt also is concerned about her R eye, looks as though vessel popped. Pt reports pain and blurry vision since Sunday. GCS 15, ambulatory in triage.
